# Patient Record
Sex: Female | Born: 1950 | Race: White | Hispanic: No | Marital: Married | Smoking: Former smoker
Health system: Southern US, Community
[De-identification: ages and names within clinical notes are randomized; demographics above are authoritative.]

## PROBLEM LIST (undated history)

## (undated) DIAGNOSIS — E039 Hypothyroidism, unspecified: Secondary | ICD-10-CM

## (undated) DIAGNOSIS — Z87440 Personal history of urinary (tract) infections: Secondary | ICD-10-CM

## (undated) DIAGNOSIS — K219 Gastro-esophageal reflux disease without esophagitis: Secondary | ICD-10-CM

## (undated) DIAGNOSIS — J189 Pneumonia, unspecified organism: Secondary | ICD-10-CM

## (undated) DIAGNOSIS — Z8719 Personal history of other diseases of the digestive system: Secondary | ICD-10-CM

## (undated) DIAGNOSIS — M069 Rheumatoid arthritis, unspecified: Secondary | ICD-10-CM

## (undated) DIAGNOSIS — T8859XA Other complications of anesthesia, initial encounter: Secondary | ICD-10-CM

## (undated) DIAGNOSIS — M1990 Unspecified osteoarthritis, unspecified site: Secondary | ICD-10-CM

## (undated) DIAGNOSIS — I10 Essential (primary) hypertension: Secondary | ICD-10-CM

## (undated) DIAGNOSIS — K5909 Other constipation: Secondary | ICD-10-CM

## (undated) DIAGNOSIS — T4145XA Adverse effect of unspecified anesthetic, initial encounter: Secondary | ICD-10-CM

## (undated) DIAGNOSIS — J449 Chronic obstructive pulmonary disease, unspecified: Secondary | ICD-10-CM

## (undated) DIAGNOSIS — G43909 Migraine, unspecified, not intractable, without status migrainosus: Secondary | ICD-10-CM

## (undated) HISTORY — PX: TUBAL LIGATION: SHX77

## (undated) HISTORY — PX: KNEE ARTHROSCOPY: SHX127

## (undated) HISTORY — PX: DIAGNOSTIC LAPAROSCOPY: SUR761

## (undated) HISTORY — PX: APPENDECTOMY: SHX54

## (undated) HISTORY — PX: JOINT REPLACEMENT: SHX530

---

## 2017-04-17 NOTE — H&P (Signed)
This is a pleasant 66 year-old female who presents to our clinic today as a new patient with right knee pain.  This began a few years ago and has progressively worsened.  All of her pain is on the medial aspect.  She does note increased instability, but no mechanical symptoms.  Pain is worse with walking.  Of note, she does have rheumatoid arthritis which she has had for about 20 years and has tried a gamut of autoimmune medications.  Currently on Methotrexate and Simponi.  As far as the pain in her knee, she has had multiple Cortisone injections, as well as a course of Visco supplementation injections which have failed to relieve her symptoms.  She knows this is coming down to replacement, but her rheumatologist wants to get her inflammatory arthropathy under control before proceeding with joint replacement.    Past medical history: Significant for rheumatoid arthritis, weight loss, loss of appetite, glasses, ringing in her ears and easy bruising.   Allergies: Macrodantin. Current medications: Methotrexate, Prednisone, Levothyroxine, folic acid, Loratadine and Simponi. Social history: Does not smoke or drink.  She is married.  She does not work.   EXAMINATION: Well-developed, well-nourished female in no acute distress.  Alert and oriented x 3.  Height: 5?2.  Weight: 105 pounds.  Blood pressure: 142/75.  Pulse: 80.  Examination of her right knee reveals range of motion 0-115 degrees.  Medial joint line tenderness.  Moderate patellofemoral crepitus.  Ligaments are stable.  Negative log roll.  Negative straight leg raise.  She is neurovascularly intact distally.     X-RAYS: X-rays show bone on bone in all three compartments.    IMPRESSION: Primary localized osteoarthritis, right knee.  PLAN: At this point Leolia has exhausted all conservative treatment options and the only relief that she will get is from a total knee replacement.  We are going to go ahead and fill out paperwork to proceed with right  total knee replacement.  Risks, benefits and possible complications of surgery have been reviewed.  Rehab and recovery time discussed.  All questions answered.  Paperwork complete.  We want to make sure Dr. Dierdre Forth thinks that her inflammatory arthropathy is under control before proceeding with this.

## 2017-04-19 NOTE — Pre-Procedure Instructions (Signed)
Natalie Franklin  04/19/2017      [id number] - [place], [place] - [address] AT [facility] [address] [place] [address] Phone: [phone] Fax: [phone]    Your procedure is scheduled on August 22  Report to [facility] Admitting at [phone] A.M.  Call this number if you have problems the morning of surgery:  [phone]   Remember:  Do not eat food or drink liquids after midnight.  Continue all other medications as directed by your physician except follow these instructions about you medications    Take these medicines the morning of surgery with A SIP OF WATER levothyroxine (SYNTHROID, LEVOTHROID), loratadine (CLARITIN) , LINZESS, predniSONE (DELTASONE)   7 days prior to surgery STOP taking any Aspirin, Aleve, Naproxen, Ibuprofen, Motrin, Advil, Goody's, BC's, all herbal medications, fish oil, and all vitamins    Do not wear jewelry, make-up or nail polish.  Do not wear lotions, powders, or perfumes, or deoderant.  Do not shave 48 hours prior to surgery.  Men may shave face and neck.  Do not bring valuables to the hospital.  [facility] is not responsible for any belongings or valuables.  Contacts, dentures or bridgework may not be worn into surgery.  Leave your suitcase in the car.  After surgery it may be brought to your room.  For patients admitted to the hospital, discharge time will be determined by your treatment team.  Patients discharged the day of surgery will not be allowed to drive home.    Special instructions:   [facility]- Preparing For Surgery  Before surgery, you can play an important role. Because skin is not sterile, your skin needs to be as free of germs as possible. You can reduce the number of germs on your skin by washing with CHG (chlorahexidine gluconate) Soap before surgery.  CHG is an antiseptic cleaner which kills germs and bonds with the skin to continue killing germs even after  washing.  Please do not use if you have an allergy to CHG or antibacterial soaps. If your skin becomes reddened/irritated stop using the CHG.  Do not shave (including legs and underarms) for at least 48 hours prior to first CHG shower. It is OK to shave your face.  Please follow these instructions carefully.   1. Shower the NIGHT BEFORE SURGERY and the MORNING OF SURGERY with CHG.   2. If you chose to wash your hair, wash your hair first as usual with your normal shampoo.  3. After you shampoo, rinse your hair and body thoroughly to remove the shampoo.  4. Use CHG as you would any other liquid soap. You can apply CHG directly to the skin and wash gently with a scrungie or a clean washcloth.   5. Apply the CHG Soap to your body ONLY FROM THE NECK DOWN.  Do not use on open wounds or open sores. Avoid contact with your eyes, ears, mouth and genitals (private parts). Wash genitals (private parts) with your normal soap.  6. Wash thoroughly, paying special attention to the area where your surgery will be performed.  7. Thoroughly rinse your body with warm water from the neck down.  8. DO NOT shower/wash with your normal soap after using and rinsing off the CHG Soap.  9. Pat yourself dry with a CLEAN TOWEL.   10. Wear CLEAN PAJAMAS   11. Place CLEAN SHEETS on your bed the night of your first  shower and DO NOT SLEEP WITH PETS.    Day of Surgery: Do not apply any deodorants/lotions. Please wear clean clothes to the hospital/surgery center.      Please read over the following fact sheets that you were given.

## 2017-04-20 ENCOUNTER — Encounter (HOSPITAL_COMMUNITY)
Admission: RE | Admit: 2017-04-20 | Discharge: 2017-04-20 | Disposition: A | Discharge status: Home / Self Care | Payer: BLUE CROSS/BLUE SHIELD | Source: Ambulatory Visit | Attending: Orthopedic Surgery | Admitting: Orthopedic Surgery

## 2017-04-20 ENCOUNTER — Encounter (HOSPITAL_COMMUNITY): Payer: Self-pay

## 2017-04-20 DIAGNOSIS — I10 Essential (primary) hypertension: Secondary | ICD-10-CM | POA: Insufficient documentation

## 2017-04-20 DIAGNOSIS — Z01812 Encounter for preprocedural laboratory examination: Secondary | ICD-10-CM | POA: Insufficient documentation

## 2017-04-20 HISTORY — DX: Essential (primary) hypertension: I10

## 2017-04-20 HISTORY — DX: Adverse effect of unspecified anesthetic, initial encounter: T41.45XA

## 2017-04-20 HISTORY — DX: Other complications of anesthesia, initial encounter: T88.59XA

## 2017-04-20 HISTORY — DX: Other constipation: K59.09

## 2017-04-20 HISTORY — DX: Gastro-esophageal reflux disease without esophagitis: K21.9

## 2017-04-20 HISTORY — DX: Rheumatoid arthritis, unspecified: M06.9

## 2017-04-20 HISTORY — DX: Personal history of urinary (tract) infections: Z87.440

## 2017-04-20 HISTORY — DX: Hypothyroidism, unspecified: E03.9

## 2017-04-20 HISTORY — DX: Unspecified osteoarthritis, unspecified site: M19.90

## 2017-04-20 HISTORY — DX: Personal history of other diseases of the digestive system: Z87.19

## 2017-04-20 LAB — URINALYSIS, COMPLETE (UACMP) WITH MICROSCOPIC
Bacteria, UA: NONE SEEN
Bilirubin Urine: NEGATIVE
GLUCOSE, UA: NEGATIVE mg/dL
KETONES UR: NEGATIVE mg/dL
Leukocytes, UA: NEGATIVE
NITRITE: NEGATIVE
PH: 5 (ref 5.0–8.0)
Protein, ur: NEGATIVE mg/dL
Specific Gravity, Urine: 1.013 (ref 1.005–1.030)

## 2017-04-20 LAB — BASIC METABOLIC PANEL
ANION GAP: 10 (ref 5–15)
BUN: 7 mg/dL (ref 6–20)
CO2: 22 mmol/L (ref 22–32)
Calcium: 8.9 mg/dL (ref 8.9–10.3)
Chloride: 104 mmol/L (ref 101–111)
Creatinine, Ser: 0.6 mg/dL (ref 0.44–1.00)
GFR calc Af Amer: >60 mL/min (ref >60)
GFR calc non Af Amer: >60 mL/min (ref >60)
GLUCOSE: 105 mg/dL — AB (ref 65–99)
POTASSIUM: 3.5 mmol/L (ref 3.5–5.1)
Sodium: 136 mmol/L (ref 135–145)

## 2017-04-20 LAB — CBC
HEMATOCRIT: 42.2 % (ref 36.0–46.0)
HEMOGLOBIN: 13.9 g/dL (ref 12.0–15.0)
MCH: 29.3 pg (ref 26.0–34.0)
MCHC: 32.9 g/dL (ref 30.0–36.0)
MCV: 88.8 fL (ref 78.0–100.0)
Platelets: 432 10*3/uL — ABNORMAL HIGH (ref 150–400)
RBC: 4.75 MIL/uL (ref 3.87–5.11)
RDW: 14 % (ref 11.5–15.5)
WBC: 17.1 10*3/uL — ABNORMAL HIGH (ref 4.0–10.5)

## 2017-04-20 LAB — SURGICAL PCR SCREEN
MRSA, PCR: NEGATIVE
Staphylococcus aureus: NEGATIVE

## 2017-04-20 LAB — ABO/RH: ABO/RH(D): AB POS

## 2017-04-20 LAB — TYPE AND SCREEN
ABO/RH(D): AB POS
Antibody Screen: NEGATIVE

## 2017-04-20 NOTE — Progress Notes (Signed)
Chart will be given to anesthesia for review due to abnormal lab WBC 17.1k.

## 2017-04-20 NOTE — Progress Notes (Signed)
[facility]  Rheumatologist- Dr. Dierdre Forth  Cardiologist - Denies  Chest x-ray - 06/21/16 (CE)  EKG - 110/14/17 Pending Records from [facility]  Stress Test - Denies  ECHO - Denies  Cardiac Cath - Denies  Sleep Study - Denies CPAP - None  Chart will be given to anesthesia pending record requests.  Pt denies having chest pain, sob, or fever at this time. All instructions explained to the pt, with a verbal understanding of the material. Pt has stopped taking Methotrexate since 04/18/17 (2 weeks prior), as directed by her rheumatologist. Pt agrees to go over the instructions while at home for a better understanding. The opportunity to ask questions was provided.

## 2017-04-21 LAB — URINE CULTURE: CULTURE: NO GROWTH

## 2017-04-23 NOTE — Progress Notes (Addendum)
Anesthesia Chart Review:  Pt is a 66 year old female scheduled for R total knee arthroplasty on 05/02/2017 with Mckinley Jewel, MD  - PCP is Levan Hurst, MD (notes in care everywhere)  - rheumatologist is Alben Deeds, MD  PMH includes:  HTN, RA, hypothyroidism.  Never smoker. BMI 18.5  Medications include: golimumab, levothyroxine, methrotrexate (stopped 04/18/17 for surgery), prednisone.  BP 123/66   Pulse 91   Temp 36.4 C (Oral)   Resp 20   Ht 5\' 2"  (1.575 m)   Wt 101 lb 1.6 oz (45.9 kg)   SpO2 100%   BMI 18.49 kg/m   Preoperative labs reviewed.   - WBC 17.1.  Pt has had intermittently elevated WBC counts in the past (see care everywhere) and saw hematologist , MD 07/17/16 for leukocytosis; Dr. 13/6/17 felt WBC increase was due either to RA process or prednisone use.   - Will recheck CBC DOS.   CXR 06/24/16 (care everywhere): COPD changes noted. Negative acute.   EKG 06/24/16 (care everywhere): NSR.  - Tracing requested  No sx of acute illness documented at PAT.  I notified Sherri in Dr. 06/26/16 office of elevated WBC, prior eval by hematology.   If pt not acutely ill, and no changes in pt's health status, I anticipate pt can proceed with surgery as scheduled.   Greig Right, FNP-BC [facility]/Anesthesiology Phone: [phone] 04/23/2017 12:10 PM

## 2017-04-30 ENCOUNTER — Ambulatory Visit (HOSPITAL_COMMUNITY)
Admission: RE | Admit: 2017-04-30 | Discharge: 2017-04-30 | Disposition: A | Discharge status: Home / Self Care | Payer: BLUE CROSS/BLUE SHIELD | Source: Ambulatory Visit | Attending: Orthopedic Surgery | Admitting: Orthopedic Surgery

## 2017-04-30 ENCOUNTER — Other Ambulatory Visit (HOSPITAL_COMMUNITY): Payer: Self-pay | Admitting: Orthopedic Surgery

## 2017-04-30 DIAGNOSIS — M79605 Pain in left leg: Secondary | ICD-10-CM | POA: Diagnosis present

## 2017-04-30 DIAGNOSIS — X58XXXA Exposure to other specified factors, initial encounter: Secondary | ICD-10-CM | POA: Insufficient documentation

## 2017-04-30 DIAGNOSIS — S8012XA Contusion of left lower leg, initial encounter: Secondary | ICD-10-CM | POA: Insufficient documentation

## 2017-04-30 DIAGNOSIS — M7989 Other specified soft tissue disorders: Secondary | ICD-10-CM | POA: Insufficient documentation

## 2017-04-30 NOTE — Progress Notes (Addendum)
VASCULAR LAB PRELIMINARY  PRELIMINARY  PRELIMINARY  PRELIMINARY  Left lower extremity venous duplex completed.    Preliminary report:  There is no DVT or SVT noted in the left lower extremity.  Area of mixed echoes noted in the proximal to mid calf, suggestive of muscle tear.   Called results to Andrena Mews, [facility], RVT 04/30/2017, 3:57 PM

## 2017-05-01 MED ORDER — CEFAZOLIN SODIUM-DEXTROSE 2-4 GM/100ML-% IV SOLN
2.0000 g | INTRAVENOUS | Status: AC
  Administered 2017-05-02: 2 g via INTRAVENOUS
  Filled 2017-05-01: qty 100

## 2017-05-01 MED ORDER — TRANEXAMIC ACID 1000 MG/10ML IV SOLN
1000.0000 mg | INTRAVENOUS | Status: AC
  Administered 2017-05-02: 1000 mg via INTRAVENOUS
  Filled 2017-05-01: qty 10

## 2017-05-01 NOTE — Anesthesia Preprocedure Evaluation (Addendum)
Anesthesia Evaluation  Patient identified by MRN, date of birth, ID band Patient awake    Reviewed: Allergy & Precautions, NPO status , Patient's Chart, lab work & pertinent test results  Airway Mallampati: II  TM Distance: >3 FB Neck ROM: Full    Dental no notable dental hx. (+) Edentulous Upper, Edentulous Lower, Upper Dentures, Lower Dentures   Pulmonary neg pulmonary ROS,    Pulmonary exam normal breath sounds clear to auscultation       Cardiovascular hypertension, negative cardio ROS Normal cardiovascular exam Rhythm:Regular Rate:Normal     Neuro/Psych  Headaches, negative neurological ROS  negative psych ROS   GI/Hepatic negative GI ROS, Neg liver ROS, hiatal hernia, GERD  ,  Endo/Other  negative endocrine ROSHypothyroidism   Renal/GU negative Renal ROS  negative genitourinary   Musculoskeletal negative musculoskeletal ROS (+) Arthritis , Osteoarthritis and Rheumatoid disorders,    Abdominal   Peds negative pediatric ROS (+)  Hematology negative hematology ROS (+)   Anesthesia Other Findings   Reproductive/Obstetrics negative OB ROS                            Anesthesia Physical Anesthesia Plan  ASA: II  Anesthesia Plan: Spinal   Post-op Pain Management:  Regional for Post-op pain   Induction:   PONV Risk Score and Plan: 2 and Ondansetron, Dexamethasone, Treatment may vary due to age or medical condition and Midazolam  Airway Management Planned: Nasal Cannula, Natural Airway and Mask  Additional Equipment:   Intra-op Plan:   Post-operative Plan:   Informed Consent:   Plan Discussed with:   Anesthesia Plan Comments: (  )        Anesthesia Quick Evaluation

## 2017-05-02 ENCOUNTER — Inpatient Hospital Stay (HOSPITAL_COMMUNITY)
Admission: RE | Admit: 2017-05-02 | Discharge: 2017-05-04 | DRG: 470 | Disposition: A | Discharge status: Home Health | Payer: BLUE CROSS/BLUE SHIELD | Source: Ambulatory Visit | Attending: Orthopedic Surgery | Admitting: Orthopedic Surgery

## 2017-05-02 ENCOUNTER — Inpatient Hospital Stay (HOSPITAL_COMMUNITY): Payer: BLUE CROSS/BLUE SHIELD | Admitting: Emergency Medicine

## 2017-05-02 ENCOUNTER — Inpatient Hospital Stay (HOSPITAL_COMMUNITY): Payer: BLUE CROSS/BLUE SHIELD

## 2017-05-02 ENCOUNTER — Encounter (HOSPITAL_COMMUNITY)
Admission: RE | Disposition: A | Discharge status: Home Health | Payer: Self-pay | Source: Ambulatory Visit | Attending: Orthopedic Surgery

## 2017-05-02 ENCOUNTER — Encounter (HOSPITAL_COMMUNITY): Payer: Self-pay | Admitting: Certified Registered Nurse Anesthetist

## 2017-05-02 DIAGNOSIS — K219 Gastro-esophageal reflux disease without esophagitis: Secondary | ICD-10-CM | POA: Diagnosis present

## 2017-05-02 DIAGNOSIS — M1711 Unilateral primary osteoarthritis, right knee: Principal | ICD-10-CM

## 2017-05-02 DIAGNOSIS — Z79899 Other long term (current) drug therapy: Secondary | ICD-10-CM

## 2017-05-02 DIAGNOSIS — Z87440 Personal history of urinary (tract) infections: Secondary | ICD-10-CM

## 2017-05-02 DIAGNOSIS — M659 Synovitis and tenosynovitis, unspecified: Secondary | ICD-10-CM | POA: Diagnosis present

## 2017-05-02 DIAGNOSIS — M069 Rheumatoid arthritis, unspecified: Secondary | ICD-10-CM | POA: Diagnosis present

## 2017-05-02 DIAGNOSIS — I10 Essential (primary) hypertension: Secondary | ICD-10-CM | POA: Diagnosis present

## 2017-05-02 DIAGNOSIS — Z972 Presence of dental prosthetic device (complete) (partial): Secondary | ICD-10-CM | POA: Diagnosis not present

## 2017-05-02 DIAGNOSIS — D62 Acute posthemorrhagic anemia: Secondary | ICD-10-CM | POA: Diagnosis not present

## 2017-05-02 DIAGNOSIS — E039 Hypothyroidism, unspecified: Secondary | ICD-10-CM | POA: Diagnosis present

## 2017-05-02 DIAGNOSIS — M85861 Other specified disorders of bone density and structure, right lower leg: Secondary | ICD-10-CM | POA: Diagnosis present

## 2017-05-02 DIAGNOSIS — K5909 Other constipation: Secondary | ICD-10-CM | POA: Diagnosis present

## 2017-05-02 DIAGNOSIS — M25561 Pain in right knee: Secondary | ICD-10-CM | POA: Diagnosis present

## 2017-05-02 DIAGNOSIS — Z7952 Long term (current) use of systemic steroids: Secondary | ICD-10-CM | POA: Diagnosis not present

## 2017-05-02 DIAGNOSIS — Z96659 Presence of unspecified artificial knee joint: Secondary | ICD-10-CM

## 2017-05-02 HISTORY — DX: Pneumonia, unspecified organism: J18.9

## 2017-05-02 HISTORY — DX: Migraine, unspecified, not intractable, without status migrainosus: G43.909

## 2017-05-02 HISTORY — PX: TOTAL KNEE ARTHROPLASTY: SHX125

## 2017-05-02 LAB — CBC
HCT: 38.7 % (ref 36.0–46.0)
Hemoglobin: 12.6 g/dL (ref 12.0–15.0)
MCH: 28.7 pg (ref 26.0–34.0)
MCHC: 32.6 g/dL (ref 30.0–36.0)
MCV: 88.2 fL (ref 78.0–100.0)
PLATELETS: 425 10*3/uL — AB (ref 150–400)
RBC: 4.39 MIL/uL (ref 3.87–5.11)
RDW: 14 % (ref 11.5–15.5)
WBC: 14.6 10*3/uL — ABNORMAL HIGH (ref 4.0–10.5)

## 2017-05-02 SURGERY — ARTHROPLASTY, KNEE, TOTAL
Anesthesia: Spinal | Laterality: Right

## 2017-05-02 MED ORDER — MEPERIDINE HCL 25 MG/ML IJ SOLN: 6.2500 mg | INTRAMUSCULAR | Status: DC | PRN

## 2017-05-02 MED ORDER — METOCLOPRAMIDE HCL 5 MG PO TABS
5.0000 mg | ORAL_TABLET | Freq: Three times a day (TID) | ORAL | Status: DC | PRN
Start: 2017-05-02 — End: 2017-05-04

## 2017-05-02 MED ORDER — ONDANSETRON HCL 4 MG/2ML IJ SOLN: 4.0000 mg | Freq: Once | INTRAMUSCULAR | Status: DC | PRN

## 2017-05-02 MED ORDER — CHLORHEXIDINE GLUCONATE 4 % EX LIQD: 60.0000 mL | Freq: Once | CUTANEOUS | Status: DC

## 2017-05-02 MED ORDER — BUPIVACAINE HCL (PF) 0.5 % IJ SOLN
INTRAMUSCULAR | Status: AC
  Filled 2017-05-02: qty 30

## 2017-05-02 MED ORDER — ALUM & MAG HYDROXIDE-SIMETH 200-200-20 MG/5ML PO SUSP: 30.0000 mL | ORAL | Status: DC | PRN

## 2017-05-02 MED ORDER — ZOLPIDEM TARTRATE 5 MG PO TABS: 5.0000 mg | ORAL_TABLET | Freq: Every evening | ORAL | Status: DC | PRN

## 2017-05-02 MED ORDER — GLYCOPYRROLATE 0.2 MG/ML IJ SOLN
INTRAMUSCULAR | Status: DC | PRN
  Administered 2017-05-02 (×2): 0.1 mg via INTRAVENOUS

## 2017-05-02 MED ORDER — MENTHOL 3 MG MT LOZG: 1.0000 | LOZENGE | OROMUCOSAL | Status: DC | PRN

## 2017-05-02 MED ORDER — BUPIVACAINE HCL (PF) 0.75 % IJ SOLN
INTRAMUSCULAR | Status: DC | PRN
  Administered 2017-05-02: 20 mL via PERINEURAL

## 2017-05-02 MED ORDER — METOCLOPRAMIDE HCL 5 MG/ML IJ SOLN
INTRAMUSCULAR | Status: DC | PRN
  Administered 2017-05-02: 10 mg via INTRAVENOUS

## 2017-05-02 MED ORDER — HYDROCODONE-ACETAMINOPHEN 7.5-325 MG PO TABS: ORAL_TABLET | ORAL | 0 refills | Status: DC

## 2017-05-02 MED ORDER — LEVOTHYROXINE SODIUM 88 MCG PO TABS
88.0000 ug | ORAL_TABLET | Freq: Every day | ORAL | Status: DC
  Administered 2017-05-03 – 2017-05-04 (×2): 88 ug via ORAL
  Filled 2017-05-02 (×2): qty 1

## 2017-05-02 MED ORDER — ONDANSETRON HCL 4 MG/2ML IJ SOLN
INTRAMUSCULAR | Status: DC | PRN
  Administered 2017-05-02: 4 mg via INTRAVENOUS

## 2017-05-02 MED ORDER — BISACODYL 5 MG PO TBEC: 5.0000 mg | DELAYED_RELEASE_TABLET | Freq: Every day | ORAL | Status: DC | PRN

## 2017-05-02 MED ORDER — BUPIVACAINE IN DEXTROSE 0.75-8.25 % IT SOLN
INTRATHECAL | Status: DC | PRN
  Administered 2017-05-02: 11 mg via INTRATHECAL

## 2017-05-02 MED ORDER — FENTANYL CITRATE (PF) 250 MCG/5ML IJ SOLN
INTRAMUSCULAR | Status: AC
Start: 2017-05-02 — End: ?
  Filled 2017-05-02: qty 5

## 2017-05-02 MED ORDER — METHOCARBAMOL 1000 MG/10ML IJ SOLN: 500.0000 mg | Freq: Four times a day (QID) | INTRAVENOUS | Status: DC | PRN

## 2017-05-02 MED ORDER — POTASSIUM CHLORIDE IN NACL 20-0.9 MEQ/L-% IV SOLN
INTRAVENOUS | Status: DC
  Administered 2017-05-02 – 2017-05-03 (×2): via INTRAVENOUS
  Filled 2017-05-02 (×2): qty 1000

## 2017-05-02 MED ORDER — ASPIRIN EC 325 MG PO TBEC: 325.0000 mg | DELAYED_RELEASE_TABLET | Freq: Every day | ORAL | 0 refills | Status: DC

## 2017-05-02 MED ORDER — MAGNESIUM CITRATE PO SOLN: 1.0000 | Freq: Once | ORAL | Status: DC | PRN

## 2017-05-02 MED ORDER — METOCLOPRAMIDE HCL 5 MG/ML IJ SOLN
INTRAMUSCULAR | Status: AC
  Filled 2017-05-02: qty 2

## 2017-05-02 MED ORDER — PHENOL 1.4 % MT LIQD: 1.0000 | OROMUCOSAL | Status: DC | PRN

## 2017-05-02 MED ORDER — SENNOSIDES-DOCUSATE SODIUM 8.6-50 MG PO TABS: 1.0000 | ORAL_TABLET | Freq: Every evening | ORAL | Status: DC | PRN

## 2017-05-02 MED ORDER — PROPOFOL 500 MG/50ML IV EMUL
INTRAVENOUS | Status: DC | PRN
  Administered 2017-05-02: 20 ug/kg/min via INTRAVENOUS

## 2017-05-02 MED ORDER — ACETAMINOPHEN 325 MG PO TABS: 650.0000 mg | ORAL_TABLET | Freq: Four times a day (QID) | ORAL | Status: DC | PRN

## 2017-05-02 MED ORDER — ENSURE ENLIVE PO LIQD
237.0000 mL | Freq: Two times a day (BID) | ORAL | Status: DC
  Administered 2017-05-03 – 2017-05-04 (×2): 237 mL via ORAL

## 2017-05-02 MED ORDER — METHOCARBAMOL 500 MG PO TABS
500.0000 mg | ORAL_TABLET | Freq: Four times a day (QID) | ORAL | Status: DC | PRN
  Administered 2017-05-02 – 2017-05-03 (×2): 500 mg via ORAL
  Filled 2017-05-02 (×2): qty 1

## 2017-05-02 MED ORDER — MIDAZOLAM HCL 2 MG/2ML IJ SOLN
INTRAMUSCULAR | Status: AC
  Administered 2017-05-02: 2 mg
  Filled 2017-05-02: qty 2

## 2017-05-02 MED ORDER — ASPIRIN EC 325 MG PO TBEC
325.0000 mg | DELAYED_RELEASE_TABLET | Freq: Every day | ORAL | Status: DC
  Administered 2017-05-03 – 2017-05-04 (×2): 325 mg via ORAL
  Filled 2017-05-02 (×2): qty 1

## 2017-05-02 MED ORDER — PREDNISONE 5 MG PO TABS
5.0000 mg | ORAL_TABLET | Freq: Two times a day (BID) | ORAL | Status: DC
  Administered 2017-05-02 – 2017-05-04 (×5): 5 mg via ORAL
  Filled 2017-05-02 (×5): qty 1

## 2017-05-02 MED ORDER — FENTANYL CITRATE (PF) 250 MCG/5ML IJ SOLN
INTRAMUSCULAR | Status: DC | PRN
  Administered 2017-05-02 (×2): 25 ug via INTRAVENOUS

## 2017-05-02 MED ORDER — KETAMINE HCL 10 MG/ML IJ SOLN
INTRAMUSCULAR | Status: DC | PRN
  Administered 2017-05-02 (×2): 10 mg via INTRAVENOUS

## 2017-05-02 MED ORDER — SODIUM CHLORIDE 0.9 % IJ SOLN
INTRAMUSCULAR | Status: DC | PRN
  Administered 2017-05-02: 40 mL via INTRAVENOUS

## 2017-05-02 MED ORDER — PROPOFOL 10 MG/ML IV BOLUS
INTRAVENOUS | Status: DC | PRN
  Administered 2017-05-02 (×4): 20 mg via INTRAVENOUS

## 2017-05-02 MED ORDER — ONDANSETRON HCL 4 MG/2ML IJ SOLN
INTRAMUSCULAR | Status: AC
  Filled 2017-05-02: qty 2

## 2017-05-02 MED ORDER — DEXAMETHASONE SODIUM PHOSPHATE 10 MG/ML IJ SOLN
INTRAMUSCULAR | Status: AC
  Filled 2017-05-02: qty 1

## 2017-05-02 MED ORDER — METOPROLOL TARTRATE 5 MG/5ML IV SOLN
INTRAVENOUS | Status: AC
  Filled 2017-05-02: qty 5

## 2017-05-02 MED ORDER — DEXAMETHASONE SODIUM PHOSPHATE 10 MG/ML IJ SOLN
INTRAMUSCULAR | Status: DC | PRN
  Administered 2017-05-02: 10 mg via INTRAVENOUS

## 2017-05-02 MED ORDER — METHOTREXATE 2.5 MG PO TABS: 12.5000 mg | ORAL_TABLET | ORAL | Status: DC

## 2017-05-02 MED ORDER — FENTANYL CITRATE (PF) 100 MCG/2ML IJ SOLN: 25.0000 ug | INTRAMUSCULAR | Status: DC | PRN

## 2017-05-02 MED ORDER — HYDROCODONE-ACETAMINOPHEN 7.5-325 MG PO TABS
1.0000 | ORAL_TABLET | ORAL | Status: DC | PRN
  Administered 2017-05-02 (×2): 1 via ORAL
  Administered 2017-05-03: 2 via ORAL
  Administered 2017-05-03 (×2): 1 via ORAL
  Administered 2017-05-03 – 2017-05-04 (×3): 2 via ORAL
  Filled 2017-05-02 (×2): qty 2
  Filled 2017-05-02: qty 1
  Filled 2017-05-02: qty 2
  Filled 2017-05-02 (×3): qty 1
  Filled 2017-05-02: qty 2
  Filled 2017-05-02: qty 1

## 2017-05-02 MED ORDER — FENTANYL CITRATE (PF) 100 MCG/2ML IJ SOLN
INTRAMUSCULAR | Status: AC
  Administered 2017-05-02: 50 ug
  Filled 2017-05-02: qty 2

## 2017-05-02 MED ORDER — LIDOCAINE 2% (20 MG/ML) 5 ML SYRINGE
INTRAMUSCULAR | Status: AC
  Filled 2017-05-02: qty 5

## 2017-05-02 MED ORDER — ONDANSETRON HCL 4 MG PO TABS: 4.0000 mg | ORAL_TABLET | Freq: Four times a day (QID) | ORAL | Status: DC | PRN

## 2017-05-02 MED ORDER — PHENYLEPHRINE HCL 10 MG/ML IJ SOLN
INTRAVENOUS | Status: DC | PRN
  Administered 2017-05-02: 25 ug/min via INTRAVENOUS

## 2017-05-02 MED ORDER — HYDROMORPHONE HCL 1 MG/ML IJ SOLN
0.5000 mg | INTRAMUSCULAR | Status: DC | PRN
  Administered 2017-05-03: 1 mg via INTRAVENOUS
  Filled 2017-05-02: qty 1

## 2017-05-02 MED ORDER — PROPOFOL 1000 MG/100ML IV EMUL
INTRAVENOUS | Status: AC
  Filled 2017-05-02: qty 100

## 2017-05-02 MED ORDER — ONDANSETRON HCL 4 MG PO TABS: 4.0000 mg | ORAL_TABLET | Freq: Three times a day (TID) | ORAL | 0 refills | Status: DC | PRN

## 2017-05-02 MED ORDER — DIPHENHYDRAMINE HCL 12.5 MG/5ML PO ELIX: 12.5000 mg | ORAL_SOLUTION | ORAL | Status: DC | PRN

## 2017-05-02 MED ORDER — METOPROLOL TARTARATE 1 MG/ML SYRINGE (5ML)
Status: DC | PRN
  Administered 2017-05-02: 2 mg via INTRAVENOUS
  Administered 2017-05-02 (×3): 1 mg via INTRAVENOUS

## 2017-05-02 MED ORDER — MIDAZOLAM HCL 2 MG/2ML IJ SOLN
INTRAMUSCULAR | Status: AC
  Filled 2017-05-02: qty 2

## 2017-05-02 MED ORDER — BUPIVACAINE LIPOSOME 1.3 % IJ SUSP
20.0000 mL | Freq: Once | INTRAMUSCULAR | Status: AC
  Administered 2017-05-02: 20 mL
  Filled 2017-05-02: qty 20

## 2017-05-02 MED ORDER — LINACLOTIDE 145 MCG PO CAPS
290.0000 ug | ORAL_CAPSULE | Freq: Every day | ORAL | Status: DC
  Administered 2017-05-03: 290 ug via ORAL
  Filled 2017-05-02 (×2): qty 2

## 2017-05-02 MED ORDER — LACTATED RINGERS IV SOLN
INTRAVENOUS | Status: DC
  Administered 2017-05-02 (×3): via INTRAVENOUS

## 2017-05-02 MED ORDER — ACETAMINOPHEN 650 MG RE SUPP: 650.0000 mg | Freq: Four times a day (QID) | RECTAL | Status: DC | PRN

## 2017-05-02 MED ORDER — SODIUM CHLORIDE 0.9 % IR SOLN
Status: DC | PRN
  Administered 2017-05-02: 3000 mL

## 2017-05-02 MED ORDER — CEFAZOLIN SODIUM-DEXTROSE 1-4 GM/50ML-% IV SOLN
1.0000 g | Freq: Four times a day (QID) | INTRAVENOUS | Status: AC
  Administered 2017-05-02 (×2): 1 g via INTRAVENOUS
  Filled 2017-05-02 (×2): qty 50

## 2017-05-02 MED ORDER — DOCUSATE SODIUM 100 MG PO CAPS
100.0000 mg | ORAL_CAPSULE | Freq: Two times a day (BID) | ORAL | Status: DC
  Administered 2017-05-02 – 2017-05-04 (×5): 100 mg via ORAL
  Filled 2017-05-02 (×5): qty 1

## 2017-05-02 MED ORDER — METOCLOPRAMIDE HCL 5 MG/ML IJ SOLN
5.0000 mg | Freq: Three times a day (TID) | INTRAMUSCULAR | Status: DC | PRN
  Administered 2017-05-03: 10 mg via INTRAVENOUS
  Filled 2017-05-02: qty 2

## 2017-05-02 MED ORDER — MIDAZOLAM HCL 2 MG/2ML IJ SOLN
INTRAMUSCULAR | Status: DC | PRN
  Administered 2017-05-02 (×2): 1 mg via INTRAVENOUS

## 2017-05-02 MED ORDER — LIDOCAINE HCL (CARDIAC) 20 MG/ML IV SOLN
INTRAVENOUS | Status: DC | PRN
  Administered 2017-05-02: 60 mg via INTRATRACHEAL

## 2017-05-02 MED ORDER — KETAMINE HCL-SODIUM CHLORIDE 100-0.9 MG/10ML-% IV SOSY
PREFILLED_SYRINGE | INTRAVENOUS | Status: AC
  Filled 2017-05-02: qty 10

## 2017-05-02 MED ORDER — ONDANSETRON HCL 4 MG/2ML IJ SOLN
4.0000 mg | Freq: Four times a day (QID) | INTRAMUSCULAR | Status: DC | PRN
  Administered 2017-05-03 (×2): 4 mg via INTRAVENOUS
  Filled 2017-05-02 (×2): qty 2

## 2017-05-02 SURGICAL SUPPLY — 58 items
BANDAGE ACE 4X5 VEL STRL LF (GAUZE/BANDAGES/DRESSINGS) ×2 IMPLANT
BANDAGE ACE 6X5 VEL STRL LF (GAUZE/BANDAGES/DRESSINGS) ×2 IMPLANT
BANDAGE ESMARK 6X9 LF (GAUZE/BANDAGES/DRESSINGS) ×1 IMPLANT
BLADE SAG 18X100X1.27 (BLADE) ×4 IMPLANT
BNDG ESMARK 6X9 LF (GAUZE/BANDAGES/DRESSINGS) ×2
BOWL SMART MIX CTS (DISPOSABLE) IMPLANT
CAPT KNEE TOTAL 3 ×2 IMPLANT
CEMENT BONE SIMPLEX SPEEDSET (Cement) ×4 IMPLANT
COVER SURGICAL LIGHT HANDLE (MISCELLANEOUS) ×2 IMPLANT
CUFF TOURNIQUET SINGLE 34IN LL (TOURNIQUET CUFF) ×2 IMPLANT
DRAPE EXTREMITY T 121X128X90 (DRAPE) ×2 IMPLANT
DRAPE U-SHAPE 47X51 STRL (DRAPES) ×2 IMPLANT
DRESSING AQUACEL AG SP 3.5X10 (GAUZE/BANDAGES/DRESSINGS) ×1 IMPLANT
DRSG AQUACEL AG ADV 3.5X10 (GAUZE/BANDAGES/DRESSINGS) ×2 IMPLANT
DRSG AQUACEL AG SP 3.5X10 (GAUZE/BANDAGES/DRESSINGS) ×2
DURAPREP 26ML APPLICATOR (WOUND CARE) ×4 IMPLANT
ELECT CAUTERY BLADE 6.4 (BLADE) ×2 IMPLANT
ELECT REM PT RETURN 9FT ADLT (ELECTROSURGICAL) ×2
ELECTRODE REM PT RTRN 9FT ADLT (ELECTROSURGICAL) ×1 IMPLANT
FACESHIELD WRAPAROUND (MASK) ×4 IMPLANT
GLOVE BIOGEL PI IND STRL 7.0 (GLOVE) ×1 IMPLANT
GLOVE BIOGEL PI IND STRL 8 (GLOVE) ×1 IMPLANT
GLOVE BIOGEL PI INDICATOR 7.0 (GLOVE) ×1
GLOVE BIOGEL PI INDICATOR 8 (GLOVE) ×1
GLOVE ECLIPSE 7.0 STRL STRAW (GLOVE) ×2 IMPLANT
GLOVE ORTHO TXT STRL SZ7.5 (GLOVE) ×2 IMPLANT
GLOVE SURG SS PI 7.5 STRL IVOR (GLOVE) ×2 IMPLANT
GLOVE SURG SS PI 8.0 STRL IVOR (GLOVE) ×4 IMPLANT
GOWN STRL REUS W/ TWL LRG LVL3 (GOWN DISPOSABLE) ×2 IMPLANT
GOWN STRL REUS W/ TWL XL LVL3 (GOWN DISPOSABLE) ×1 IMPLANT
GOWN STRL REUS W/TWL LRG LVL3 (GOWN DISPOSABLE) ×2
GOWN STRL REUS W/TWL XL LVL3 (GOWN DISPOSABLE) ×1
HANDPIECE INTERPULSE COAX TIP (DISPOSABLE) ×1
IMMOBILIZER KNEE 22 UNIV (SOFTGOODS) IMPLANT
KIT BASIN OR (CUSTOM PROCEDURE TRAY) ×2 IMPLANT
KIT ROOM TURNOVER OR (KITS) ×2 IMPLANT
MANIFOLD NEPTUNE II (INSTRUMENTS) ×2 IMPLANT
NEEDLE 18GX1X1/2 (RX/OR ONLY) (NEEDLE) ×2 IMPLANT
NEEDLE HYPO 25GX1X1/2 BEV (NEEDLE) IMPLANT
NS IRRIG 1000ML POUR BTL (IV SOLUTION) ×2 IMPLANT
PACK TOTAL JOINT (CUSTOM PROCEDURE TRAY) ×2 IMPLANT
PAD ARMBOARD 7.5X6 YLW CONV (MISCELLANEOUS) ×4 IMPLANT
SET HNDPC FAN SPRY TIP SCT (DISPOSABLE) ×1 IMPLANT
STRIP CLOSURE SKIN 1/2X4 (GAUZE/BANDAGES/DRESSINGS) ×2 IMPLANT
SUCTION FRAZIER HANDLE 10FR (MISCELLANEOUS)
SUCTION TUBE FRAZIER 10FR DISP (MISCELLANEOUS) IMPLANT
SUT MNCRL AB 4-0 PS2 18 (SUTURE) ×2 IMPLANT
SUT VIC AB 0 CT1 27 (SUTURE)
SUT VIC AB 0 CT1 27XBRD ANBCTR (SUTURE) IMPLANT
SUT VIC AB 1 CTX 36 (SUTURE) ×1
SUT VIC AB 1 CTX36XBRD ANBCTR (SUTURE) ×1 IMPLANT
SUT VIC AB 2-0 CT1 27 (SUTURE) ×2
SUT VIC AB 2-0 CT1 TAPERPNT 27 (SUTURE) ×2 IMPLANT
SYR 50ML LL SCALE MARK (SYRINGE) ×2 IMPLANT
SYR CONTROL 10ML LL (SYRINGE) IMPLANT
TOWEL GREEN STERILE (TOWEL DISPOSABLE) ×2 IMPLANT
TRAY CATH 16FR W/PLASTIC CATH (SET/KITS/TRAYS/PACK) IMPLANT
WATER STERILE IRR 1000ML POUR (IV SOLUTION) ×2 IMPLANT

## 2017-05-02 NOTE — Interval H&P Note (Signed)
History and Physical Interval Note:  05/02/2017 7:38 AM  Natalie Franklin  has presented today for surgery, with the diagnosis of djd right knee  The various methods of treatment have been discussed with the patient and family. After consideration of risks, benefits and other options for treatment, the patient has consented to  Procedure(s): TOTAL KNEE ARTHROPLASTY (Right) as a surgical intervention .  The patient's history has been reviewed, patient examined, no change in status, stable for surgery.  I have reviewed the patient's chart and labs.  Questions were answered to the patient's satisfaction.     Loreta Ave

## 2017-05-02 NOTE — Transfer of Care (Signed)
Immediate Anesthesia Transfer of Care Note  Patient: MEEYA GOLDIN  Procedure(s) Performed: Procedure(s): TOTAL KNEE ARTHROPLASTY (Right)  Patient Location: PACU  Anesthesia Type:MAC and Spinal  Level of Consciousness: awake, alert  and drowsy  Airway & Oxygen Therapy: Patient Spontanous Breathing and Patient connected to nasal cannula oxygen  Post-op Assessment: Report given to RN, Post -op Vital signs reviewed and stable, Patient moving all extremities X 4 and Patient able to stick tongue midline  Post vital signs: Reviewed and stable  Last Vitals:  Vitals:   05/02/17 0729  BP: 127/75  Pulse: 78  Resp: 20  Temp: 36.9 C  SpO2: 98%    Last Pain:  Vitals:   05/02/17 0737  TempSrc:   PainSc: 2       Patients Stated Pain Goal: 2 (86/76/19 5093)  Complications: No apparent anesthesia complications

## 2017-05-02 NOTE — Anesthesia Procedure Notes (Signed)
Spinal  Patient location during procedure: OR Start time: 05/02/2017 8:39 AM End time: 05/02/2017 8:50 AM Staffing Anesthesiologist: Sid Greener Preanesthetic Checklist Completed: patient identified, site marked, surgical consent, pre-op evaluation, timeout performed, IV checked, risks and benefits discussed and monitors and equipment checked Spinal Block Patient position: sitting Prep: DuraPrep Patient monitoring: heart rate, cardiac monitor, continuous pulse ox and blood pressure Approach: midline Location: L3-4 Injection technique: single-shot Needle Needle type: Sprotte  Needle gauge: 24 G Needle length: 9 cm Assessment Sensory level: T4

## 2017-05-02 NOTE — Progress Notes (Signed)
Orthopedic Tech Progress Note Patient Details:  Natalie Franklin 12/07/1950 [id number]  CPM Right Knee CPM Right Knee: On Right Knee Flexion (Degrees): 90 Right Knee Extension (Degrees): 0 Additional Comments: trapeze bar patient helper   [name] 05/02/2017, 11:32 AM Viewed order from doctor's order list

## 2017-05-02 NOTE — Discharge Instructions (Signed)

## 2017-05-02 NOTE — Discharge Summary (Addendum)
Patient ID: Natalie Franklin MRN: [id number] DOB/AGE: 01-28-1951 66 y.o.  Admit date: 05/02/2017 Discharge date: 05/04/2017  Admission Diagnoses:  Principal Problem:   Primary localized osteoarthritis of right knee   Discharge Diagnoses:  Same  Past Medical History:  Diagnosis Date  . Chronic constipation   . Complication of anesthesia    Hard to Awaken post anesthesia  . GERD (gastroesophageal reflux disease)   . History of frequent urinary tract infections   . History of hiatal hernia   . Hypertension   . Hypothyroidism   . Migraine    "none in a couple years" (05/02/2017)  . Osteoarthritis   . Pneumonia    "had it in my younger years; haven't had it in a long time" (05/02/2017)  . Rheumatoid arthritis (HCC)    "qwhere" (05/02/2017)    Surgeries: Procedure(s): TOTAL KNEE ARTHROPLASTY on 05/02/2017   Consultants:   Discharged Condition: Improved  Hospital Course: Natalie Franklin is an 66 y.o. female who was admitted 05/02/2017 for operative treatment ofPrimary localized osteoarthritis of right knee. Patient has severe unremitting pain that affects sleep, daily activities, and work/hobbies. After pre-op clearance the patient was taken to the operating room on 05/02/2017 and underwent  Procedure(s): TOTAL KNEE ARTHROPLASTY.    Patient was given perioperative antibiotics:  Anti-infectives    Start     Dose/Rate Route Frequency Ordered Stop   05/02/17 1430  ceFAZolin (ANCEF) IVPB 1 g/50 mL premix     1 g 100 mL/hr over 30 Minutes Intravenous Every 6 hours 05/02/17 1325 05/02/17 2134   05/02/17 0845  ceFAZolin (ANCEF) IVPB 2g/100 mL premix     2 g 200 mL/hr over 30 Minutes Intravenous On call to O.R. 05/01/17 1224 05/02/17 0908       Patient was given sequential compression devices, early ambulation, and chemoprophylaxis to prevent DVT.  Patient benefited maximally from hospital stay and there were no complications.    Recent vital signs:  Patient Vitals for the  past 24 hrs:  BP Temp Temp src Pulse Resp SpO2  05/04/17 0537 (!) 124/58 98.1 F (36.7 C) Oral 72 18 97 %  05/03/17 2040 119/61 98.6 F (37 C) Oral 67 20 97 %  05/03/17 1300 126/65 98.7 F (37.1 C) Oral 61 18 100 %     Recent laboratory studies:   Recent Labs  05/02/17 0727 05/03/17 0416  WBC 14.6* 16.2*  HGB 12.6 9.7*  HCT 38.7 30.1*  PLT 425* 339  NA  --  135  K  --  4.1  CL  --  107  CO2  --  23  BUN  --  5*  CREATININE  --  0.47  GLUCOSE  --  124*  CALCIUM  --  8.4*     Discharge Medications:   Allergies as of 05/04/2017      Reactions   Macrodantin [nitrofurantoin Macrocrystal] Other (See Comments)   Hospitalized for possible interstitial pulmonary disease CAUSED PT TO GET CHEMICAL PNEUMONIA   Leflunomide Nausea Only   Septra [sulfamethoxazole-trimethoprim] Other (See Comments)   PT HAD BUSTED TENDONS AND TOLD NOT TO TAKE    Albuterol Other (See Comments)   headache   Celecoxib Nausea Only      Medication List    TAKE these medications   aspirin EC 325 MG tablet Take 1 tablet (325 mg total) by mouth daily.   B COMPLEX-VITAMIN B12 PO Take 1 tablet by mouth daily.   Biotin 5000 MCG Subl Place 10,000  mcg under the tongue daily.   folic acid 1 MG tablet Commonly known as:  FOLVITE Take 1 mg by mouth daily before breakfast.   HYDROcodone-acetaminophen 7.5-325 MG tablet Commonly known as:  NORCO Take 1-2 tabs po q4-6 hours prn pain   levothyroxine 88 MCG tablet Commonly known as:  SYNTHROID, LEVOTHROID Take 88 mcg by mouth daily before breakfast.   LINZESS 290 MCG Caps capsule Generic drug:  linaclotide Take 290 mcg by mouth daily.   loratadine 10 MG tablet Commonly known as:  CLARITIN Take 10 mg by mouth daily as needed for allergies.   methotrexate 2.5 MG tablet Commonly known as:  RHEUMATREX Take 12.5 mg by mouth every Thursday. Caution:Chemotherapy. Protect from light.   ondansetron 4 MG tablet Commonly known as:  ZOFRAN Take 1  tablet (4 mg total) by mouth every 8 (eight) hours as needed for nausea or vomiting.   predniSONE 5 MG tablet Commonly known as:  DELTASONE Take 5 mg by mouth 2 (two) times daily. FOR 30 DAYS   SIMPONI ARIA IV Inject into the vein every 28 (twenty-eight) days.            Discharge Care Instructions        Start     Ordered   05/02/17 0000  ondansetron (ZOFRAN) 4 MG tablet  Every 8 hours PRN     05/02/17 0955   05/02/17 0000  aspirin EC 325 MG tablet  Daily    Comments:  1 tab a day for the next 30 days to prevent blood clots   05/02/17 0955   05/02/17 0000  HYDROcodone-acetaminophen (NORCO) 7.5-325 MG tablet     05/02/17 6222      Diagnostic Studies: Dg Knee Right Port  Result Date: 05/02/2017 CLINICAL DATA:  Postop for right knee arthroplasty. EXAM: PORTABLE RIGHT KNEE - 1-2 VIEW COMPARISON:  None. FINDINGS: Total knee arthroplasty. No periprosthetic fracture or acute hardware complication. Expected soft tissue swelling and gas about the knee and within the suprapatellar bursa. IMPRESSION: Expected appearance after right knee arthroplasty. Electronically Signed   By: Jeronimo Greaves M.D.   On: 05/02/2017 11:06    Disposition: Final discharge disposition not confirmed    Follow-up Information    Loreta Ave, MD. Schedule an appointment as soon as possible for a visit in 2 week(s).   Specialty:  Orthopedic Surgery Contact information: [address] [place] [address] [phone]        [facility] Follow up.   Specialty:  Home Health Services Why:  A representative from [facility] will contact you to arrange start date and time for your therapy. Contact information: [address] [place] [address] [phone]            Signed: Otilio Saber 05/04/2017, 8:08 AM

## 2017-05-02 NOTE — Op Note (Signed)
NAME:  Natalie Franklin, Natalie Franklin NO.:  [id number]  MEDICAL RECORD NO.:  [id number]  LOCATION:  [facility]                         FACILITY:  [facility]  PHYSICIAN:  Loreta Ave, M.D. DATE OF BIRTH:  Sep 09, 1951  DATE OF PROCEDURE:  05/02/2017 DATE OF DISCHARGE:                              OPERATIVE REPORT   PREOPERATIVE DIAGNOSIS:  Right knee end-stage arthritis secondary to rheumatoid arthritis.  POSTOPERATIVE DIAGNOSIS:  Right knee end-stage arthritis secondary to rheumatoid arthritis.  Marked residual hypertrophic synovitis. Underlying osteopenia.  PROCEDURE:  Modified minimally invasive right total knee replacement utilizing Stryker triathlon prosthesis.  All components cemented.  A pegged cruciate retaining #3 femoral component.  #3 tibial component, 9- mm CS insert.  A 29 mm patellar component.  Also extensive synovectomy.  SURGEON:  Loreta Ave, M.D.  ASSISTANT:  Tessa Lerner, PA, present throughout the entire case and necessary for timely completion of procedure.  ANESTHESIA:  Spinal.  BLOOD LOSS:  Minimal.  SPECIMENS:  None.  CULTURES:  None.  COMPLICATIONS:  None.  DRESSINGS:  Sterile compressive, knee immobilizer.  TOURNIQUET TIME:  45 minutes.  DESCRIPTION OF PROCEDURE:  The patient was brought to operating room and after adequate anesthesia had been obtained, tourniquet applied. Prepped and draped in usual sterile fashion.  Exsanguinated with elevation of Esmarch.  Tourniquet inflated to 300 mmHg.  A straight incision above the patella down the tibial tubercle.  Medial arthrotomy, vastus splitting.  Marked residual hypertrophic synovitis throughout, treated with tricompartmental synovectomy.  Distal femoral resection. Flexible intramedullary guide, 5 degrees of valgus.  Using epicondylar axis, femur was sized, cut, and fitted for cruciate retaining #3 component.  Proximal tibial resection with extramedullary guide. Rotation set with  trials, hand reamed.  Patella exposed, posterior 9 mm removed.  Drilled, sized, and fitted for 29-mm component.  Copious irrigation of the knee.  Cement prepared and placed on all components, firmly seated.  Polyethylene attached to tibia, knee reduced.  Patella held with a clamp.  Once the cement hardened, the knee was irrigated again.  Soft tissue injected with Exparel.  Arthrotomy closed with #1 Vicryl with subcutaneous subcuticular closure.  At completion, excellent biomechanical axis, good tracking, good stability, nicely balanced knee.  Tourniquet removed.  A sterile compressive dressing applied.  Knee immobilizer applied. Anesthesia reversed.  Brought to the recovery room.  Tolerated the surgery well.  No complications.     Loreta Ave, M.D.     DFM/MEDQ  D:  05/02/2017  T:  05/02/2017  Job:  [id number]

## 2017-05-02 NOTE — Progress Notes (Signed)
Pt admitted to the unit from pacu; pt A&O x4; MAE x4; report decreased sensation with some tingling and numbness to RLE. RLE in CPM; pt due to void per report by 1630. IV intact and transfusing; skin intact with no wounds or pressure ulcer noted except for RLE incision with compression dsg clean, dry and intact. Pt oriented to the unit and room; fall/safety precaution and prevention education completed with pt and family at bedside. VSS; Call light within reach and will closely monitor pt. Dionne Bucy RN

## 2017-05-02 NOTE — Evaluation (Signed)
Physical Therapy Evaluation Patient Details Name: Natalie Franklin MRN: [id number] DOB: 31-Dec-1950 Today's Date: 05/02/2017   History of Present Illness  Pt is a 66 y/o female s/p elective R TKA. PMH includes RA and HTN. Pt previously presented to ED with LLE pain and dopplers negative for DVT, but did exhibit possible muscle tear.   Clinical Impression  Pt is s/p surgery above with deficits below. PTA, pt was independent with functional mobility. Upon eval, pt limited by post op pain and weakness, as well as, slightly decreased balance. Pt requiring min to min guard assist for mobility this session. Pt reports she will be getting HHPT services at d/c and husband and sister will be available to assist as needed upon d/c. Pt has all necessary DME at home. Will continue to follow acutely to maximize functional mobility independence and safety.     Follow Up Recommendations DC plan and follow up therapy as arranged by surgeon;Supervision for mobility/OOB    Equipment Recommendations  None recommended by PT (has all DMe )    Recommendations for Other Services       Precautions / Restrictions Precautions Precautions: Knee Precaution Booklet Issued: Yes (comment) Precaution Comments: Reviewed supine ther ex with pt  Restrictions Weight Bearing Restrictions: Yes RLE Weight Bearing: Weight bearing as tolerated      Mobility  Bed Mobility Overal bed mobility: Needs Assistance Bed Mobility: Supine to Sit;Sit to Supine     Supine to sit: Supervision Sit to supine: Supervision   General bed mobility comments: Supervision for safety. Use of bed rails and elevated HOB.   Transfers Overall transfer level: Needs assistance Equipment used: Rolling walker (2 wheeled) Transfers: Sit to/from Stand Sit to Stand: Min assist         General transfer comment: Min A for steadying. Verbal cues for safe hand placement.   Ambulation/Gait Ambulation/Gait assistance: Min guard Ambulation  Distance (Feet): 75 Feet Assistive device: Rolling walker (2 wheeled) Gait Pattern/deviations: Step-to pattern;Step-through pattern;Decreased step length - right;Decreased step length - left;Decreased weight shift to right;Antalgic Gait velocity: Decreased Gait velocity interpretation: Below normal speed for age/gender General Gait Details: Slow, slightly antalgic gait secondary to post op pain. Verbal cues for sequencing with RW and for upright posture. Verbal cues for knee flexion during swing phase on RLE.   Stairs            Wheelchair Mobility    Modified Rankin (Stroke Patients Only)       Balance Overall balance assessment: Needs assistance Sitting-balance support: No upper extremity supported;Feet supported Sitting balance-Leahy Scale: Good     Standing balance support: Bilateral upper extremity supported;During functional activity Standing balance-Leahy Scale: Poor Standing balance comment: Reliant on RW for stability                              Pertinent Vitals/Pain Pain Assessment: 0-10 Pain Score: 5  Pain Location: R knee  Pain Descriptors / Indicators: Aching;Operative site guarding Pain Intervention(s): Limited activity within patient's tolerance;Monitored during session;Repositioned    Home Living Family/patient expects to be discharged to:: Private residence Living Arrangements: Spouse/significant other Available Help at Discharge: Family;Available 24 hours/day Type of Home: House Home Access: Stairs to enter Entrance Stairs-Rails: Right Entrance Stairs-Number of Steps: 5 Home Layout: One level Home Equipment: Walker - 2 wheels;Bedside commode;Other (comment) (CPM)      Prior Function Level of Independence: Independent  Hand Dominance   Dominant Hand: Right    Extremity/Trunk Assessment   Upper Extremity Assessment Upper Extremity Assessment: Defer to OT evaluation    Lower Extremity Assessment Lower  Extremity Assessment: RLE deficits/detail RLE Deficits / Details: Sensory in tact. Deficits consistent with post op pain and weakness. Able to perform exercises below.     Cervical / Trunk Assessment Cervical / Trunk Assessment: Normal  Communication   Communication: No difficulties  Cognition Arousal/Alertness: Awake/alert Behavior During Therapy: WFL for tasks assessed/performed Overall Cognitive Status: Within Functional Limits for tasks assessed                                        General Comments General comments (skin integrity, edema, etc.): Pt's husband and sons present during session.     Exercises Total Joint Exercises Ankle Circles/Pumps: AROM;Both;10 reps;Supine Quad Sets: AROM;Right;10 reps;Supine Towel Squeeze: AROM;Both;10 reps;Supine Short Arc Quad: AROM;Right;10 reps;Supine Heel Slides: AROM;Right;10 reps;Supine Hip ABduction/ADduction: AROM;Right;10 reps;Supine   Assessment/Plan    PT Assessment Patient needs continued PT services  PT Problem List Decreased strength;Decreased range of motion;Decreased mobility;Decreased balance;Decreased coordination;Decreased knowledge of use of DME;Pain       PT Treatment Interventions DME instruction;Gait training;Stair training;Functional mobility training;Therapeutic activities;Therapeutic exercise;Balance training;Neuromuscular re-education;Patient/family education    PT Goals (Current goals can be found in the Care Plan section)  Acute Rehab PT Goals Patient Stated Goal: to go home  PT Goal Formulation: With patient Time For Goal Achievement: 05/09/17 Potential to Achieve Goals: Good    Frequency 7X/week   Barriers to discharge        Co-evaluation               AM-PAC PT "6 Clicks" Daily Activity  Outcome Measure Difficulty turning over in bed (including adjusting bedclothes, sheets and blankets)?: A Little Difficulty moving from lying on back to sitting on the side of the bed? :  A Little Difficulty sitting down on and standing up from a chair with arms (e.g., wheelchair, bedside commode, etc,.)?: Unable Help needed moving to and from a bed to chair (including a wheelchair)?: A Little Help needed walking in hospital room?: A Little Help needed climbing 3-5 steps with a railing? : A Little 6 Click Score: 16    End of Session Equipment Utilized During Treatment: Gait belt Activity Tolerance: Patient tolerated treatment well Patient left: in bed;with call bell/phone within reach;with family/visitor present Nurse Communication: Mobility status PT Visit Diagnosis: Other abnormalities of gait and mobility (R26.89);Pain Pain - Right/Left: Right Pain - part of body: Knee    Time: 1757-1830 PT Time Calculation (min) (ACUTE ONLY): 33 min   Charges:   PT Evaluation $PT Eval Low Complexity: 1 Low PT Treatments $Gait Training: 8-22 mins   PT G Codes:        Gladys Damme, PT, DPT  Acute Rehabilitation Services  Pager: [phone]   Lehman Prom 05/02/2017, 6:40 PM

## 2017-05-02 NOTE — Anesthesia Procedure Notes (Signed)
Anesthesia Regional Block: Adductor canal block   Pre-Anesthetic Checklist: ,, timeout performed, Correct Patient, Correct Site, Correct Laterality, Correct Procedure, Correct Position, site marked, Risks and benefits discussed,  Surgical consent,  Pre-op evaluation,  At surgeon's request and post-op pain management  Laterality: Right  Prep: chloraprep       Needles:  Injection technique: Single-shot  Needle Type: Echogenic Stimulator Needle     Needle Length: 5cm  Needle Gauge: 22     Additional Needles:   Procedures: ultrasound guided, nerve stimulator,,,,,,  Narrative:  Start time: 05/02/2017 8:10 AM End time: 05/02/2017 8:22 AM Injection made incrementally with aspirations every 5 mL.  Performed by: Personally  Anesthesiologist: Swayzie Choate  Additional Notes: Functioning IV was confirmed and monitors were applied.  A 42mm 22ga Arrow echogenic stimulator needle was used. Sterile prep and drape,hand hygiene and sterile gloves were used. Ultrasound guidance: relevant anatomy identified, needle position confirmed, local anesthetic spread visualized around nerve(s)., vascular puncture avoided.  Image printed for medical record. Negative aspiration and negative test dose prior to incremental administration of local anesthetic. The patient tolerated the procedure well.

## 2017-05-02 NOTE — Anesthesia Postprocedure Evaluation (Signed)
Anesthesia Post Note  Patient: Natalie Franklin  Procedure(s) Performed: Procedure(s) (LRB): TOTAL KNEE ARTHROPLASTY (Right)     Patient location during evaluation: PACU Anesthesia Type: Spinal Level of consciousness: oriented and awake and alert Pain management: pain level controlled Vital Signs Assessment: post-procedure vital signs reviewed and stable Respiratory status: spontaneous breathing, respiratory function stable and patient connected to nasal cannula oxygen Cardiovascular status: blood pressure returned to baseline and stable Postop Assessment: no headache and no backache Anesthetic complications: no    Last Vitals:  Vitals:   05/02/17 1100 05/02/17 1115  BP: (!) 145/86 140/82  Pulse:    Resp:    Temp:    SpO2:      Last Pain:  Vitals:   05/02/17 1115  TempSrc:   PainSc: 0-No pain                 Tyronica Truxillo

## 2017-05-03 ENCOUNTER — Encounter (HOSPITAL_COMMUNITY): Payer: Self-pay | Admitting: Orthopedic Surgery

## 2017-05-03 LAB — CBC
HCT: 30.1 % — ABNORMAL LOW (ref 36.0–46.0)
Hemoglobin: 9.7 g/dL — ABNORMAL LOW (ref 12.0–15.0)
MCH: 29 pg (ref 26.0–34.0)
MCHC: 32.2 g/dL (ref 30.0–36.0)
MCV: 89.9 fL (ref 78.0–100.0)
PLATELETS: 339 10*3/uL (ref 150–400)
RBC: 3.35 MIL/uL — ABNORMAL LOW (ref 3.87–5.11)
RDW: 14.5 % (ref 11.5–15.5)
WBC: 16.2 10*3/uL — ABNORMAL HIGH (ref 4.0–10.5)

## 2017-05-03 LAB — BASIC METABOLIC PANEL
ANION GAP: 5 (ref 5–15)
BUN: 5 mg/dL — ABNORMAL LOW (ref 6–20)
CO2: 23 mmol/L (ref 22–32)
Calcium: 8.4 mg/dL — ABNORMAL LOW (ref 8.9–10.3)
Chloride: 107 mmol/L (ref 101–111)
Creatinine, Ser: 0.47 mg/dL (ref 0.44–1.00)
GFR calc Af Amer: >60 mL/min (ref >60)
Glucose, Bld: 124 mg/dL — ABNORMAL HIGH (ref 65–99)
POTASSIUM: 4.1 mmol/L (ref 3.5–5.1)
Sodium: 135 mmol/L (ref 135–145)

## 2017-05-03 NOTE — Care Management Note (Signed)
Case Management Note  Patient Details  Name: Natalie Franklin MRN: [id number] Date of Birth: [birth year]  Subjective/Objective: 66 yr old female s/p right total knee arthroplasty.                    Action/Plan:  Patient was preoperatively setup with [facility], no changes. DME has been delivered. Patient will have assistance at discharge.   Expected Discharge Date:  05/03/17               Expected Discharge Plan:  Home w Home Health Services  In-House Referral:  NA  Discharge planning Services  [facility]  Post Acute Care Choice:  Home Health, Durable Medical Equipment Choice offered to:  Patient  DME Arranged:  3-N-1, CPM, Walker rolling DME Agency:  [employer]  HH Arranged:  PT HH Agency:  [facility]  Status of Service:  Completed, signed off  If discussed at [employer] [facility] [employer], dates discussed:    Additional Comments:  Durenda Guthrie, RN 05/03/2017, 4:00 PM

## 2017-05-03 NOTE — Evaluation (Signed)
Occupational Therapy Evaluation Patient Details Name: Natalie Franklin MRN: [id number] DOB: [birth year] Today's Date: 05/03/2017    History of Present Illness Pt is a 66 y/o female s/p elective R TKA. PMH includes RA and HTN. Pt previously presented to ED with LLE pain and dopplers negative for DVT, but did exhibit possible muscle tear.    Clinical Impression   Pt admitted with the above diagnoses and presents with below problem list. Pt will benefit from continued acute OT to address the below listed deficits and maximize independence with basic ADLs prior to d/c home. PTA pt was independent with ADLs. Pt is currently min guard with LB ADLS, toilet/shower transfers, and functional mobility.       Follow Up Recommendations  DC plan and follow up therapy as arranged by surgeon    Equipment Recommendations  3 in 1 bedside commode    Recommendations for Other Services       Precautions / Restrictions Precautions Precautions: Knee Precaution Comments: reviewed precautions Restrictions Weight Bearing Restrictions: Yes RLE Weight Bearing: Weight bearing as tolerated      Mobility Bed Mobility Overal bed mobility: Needs Assistance             General bed mobility comments: not observed.   Transfers Overall transfer level: Needs assistance Equipment used: Rolling walker (2 wheeled)             General transfer comment: from 3n1 and recliner.    Balance Overall balance assessment: Needs assistance Sitting-balance support: No upper extremity supported;Feet supported Sitting balance-Leahy Scale: Good     Standing balance support: Bilateral upper extremity supported;During functional activity Standing balance-Leahy Scale: Poor Standing balance comment: Reliant on RW for stability                            ADL either performed or assessed with clinical judgement   ADL Overall ADL's : Needs assistance/impaired Eating/Feeding: Set up;Sitting    Grooming: Min guard;Standing   Upper Body Bathing: Set up;Sitting   Lower Body Bathing: Min guard;Sit to/from stand   Upper Body Dressing : Set up;Sitting   Lower Body Dressing: Min guard;Sit to/from stand   Toilet Transfer: Min guard;Ambulation;RW (3n1 over toilet)   Toileting- Clothing Manipulation and Hygiene: Min guard;Sit to/from stand   Tub/ Shower Transfer: Tub transfer;Min guard;Ambulation;Shower seat;3 in Scientist, water quality Details (indicate cue type and reason): clinical judgement. handout given and technique reviewed and visually demonstrated with spouse present.  Functional mobility during ADLs: Min guard;Rolling walker General ADL Comments: Pt received in bathroom. Completed toilet transfer as detailed above. Ambulated back to chair. ADL toilet/tub transfer education given.      Vision         Perception     Praxis      Pertinent Vitals/Pain Pain Assessment: 0-10 Pain Score: 7  Pain Location: R knee  Pain Descriptors / Indicators: Aching;Operative site guarding Pain Intervention(s): Limited activity within patient's tolerance;Repositioned;Monitored during session;Patient requesting pain meds-RN notified;RN gave pain meds during session     Hand Dominance Right   Extremity/Trunk Assessment Upper Extremity Assessment Upper Extremity Assessment: Overall WFL for tasks assessed   Lower Extremity Assessment Lower Extremity Assessment: Defer to PT evaluation   Cervical / Trunk Assessment Cervical / Trunk Assessment: Normal   Communication Communication Communication: No difficulties   Cognition Arousal/Alertness: Awake/alert Behavior During Therapy: WFL for tasks assessed/performed Overall Cognitive Status: Within Functional Limits for tasks assessed  General Comments  Spouse present and involved in session.    Exercises     Shoulder Instructions      Home Living Family/patient  expects to be discharged to:: Private residence Living Arrangements: Spouse/significant other Available Help at Discharge: Family;Available 24 hours/day Type of Home: House Home Access: Stairs to enter [employer] of Steps: 5 Entrance Stairs-Rails: Right Home Layout: One level     Bathroom Shower/Tub: Chief Strategy Officer: Standard     Home Equipment: Environmental consultant - 2 wheels;Bedside commode;Other (comment)          Prior Functioning/Environment Level of Independence: Independent                 OT Problem List: Impaired balance (sitting and/or standing);Decreased knowledge of use of DME or AE;Decreased knowledge of precautions;Pain      OT Treatment/Interventions: Self-care/ADL training;DME and/or AE instruction;Therapeutic activities;Patient/family education;Balance training    OT Goals(Current goals can be found in the care plan section) Acute Rehab OT Goals Patient Stated Goal: to go home  OT Goal Formulation: With patient/family Time For Goal Achievement: 05/10/17 Potential to Achieve Goals: Good ADL Goals Pt Will Perform Lower Body Bathing: with modified independence;sit to/from stand Pt Will Perform Lower Body Dressing: with modified independence;sit to/from stand Pt Will Perform Tub/Shower Transfer: Tub transfer;with supervision;ambulating;shower seat;3 in 1;rolling walker  OT Frequency: Min 2X/week   Barriers to D/C:            Co-evaluation              AM-PAC PT "6 Clicks" Daily Activity     Outcome Measure Help from another person eating meals?: None Help from another person taking care of personal grooming?: None Help from another person toileting, which includes using toliet, bedpan, or urinal?: A Little Help from another person bathing (including washing, rinsing, drying)?: A Little Help from another person to put on and taking off regular upper body clothing?: None Help from another person to put on and taking off  regular lower body clothing?: A Little 6 Click Score: 21   End of Session Equipment Utilized During Treatment: Rolling walker CPM Right Knee CPM Right Knee: Off Right Knee Flexion (Degrees): 60 Right Knee Extension (Degrees): 0  Activity Tolerance: Patient limited by pain;Patient tolerated treatment well Patient left: Other (comment) (with PT)  OT Visit Diagnosis: Unsteadiness on feet (R26.81);Pain Pain - Right/Left: Right Pain - part of body: Knee                Time: 0160-1093 OT Time Calculation (min): 24 min Charges:  OT General Charges $OT Visit: 1 Procedure OT Evaluation $OT Eval Low Complexity: 1 Procedure G-Codes:       Pilar Grammes 05/03/2017, 9:41 AM

## 2017-05-03 NOTE — Progress Notes (Signed)
Orthopedic Tech Progress Note Patient Details:  Natalie Franklin August 18, 1951 [id number]  Patient ID: [name], female   DOB: 24-Jun-1951, 66 y.o.   MRN: [id number]   [name] 05/03/2017, 12:56 PM Placed pt's rle on cpm @0 -60 degrees @1255 ; RN notified

## 2017-05-03 NOTE — Progress Notes (Signed)
Orthopedic Tech Progress Note Patient Details:  Natalie Franklin [birth year] [id number]  Ortho Devices Ortho Device/Splint Location: on cpm at 1800   [name] 05/03/2017, 6:04 PM

## 2017-05-03 NOTE — Progress Notes (Signed)
Physical Therapy Treatment Patient Details Name: Natalie Franklin MRN: [id number] DOB: 05-Feb-1951 Today's Date: 05/03/2017    History of Present Illness Pt is a 66 y/o female s/p elective R TKA. PMH includes RA and HTN. Pt previously presented to ED with LLE pain and dopplers negative for DVT, but did exhibit possible muscle tear.     PT Comments    Pt tx session limited due to nauseous after receiving IV dilaudid in standing.  Pt quickly became nauseous and dizzy.  Pt returned to supine and attempted supine ex but only able to perform x 3 exercises before dry heaving.  Educated patient on food intake with meds.  Pt eating saltines and drinking gingerale.  Informed RN who brought antinausea meds.  Will follow up in pm for increased activity.     Follow Up Recommendations  DC plan and follow up therapy as arranged by surgeon;Supervision for mobility/OOB     Equipment Recommendations  None recommended by PT (Has all DME)    Recommendations for Other Services       Precautions / Restrictions Precautions Precautions: Knee Precaution Booklet Issued: Yes (comment) Precaution Comments: reviewed precautions Restrictions Weight Bearing Restrictions: Yes RLE Weight Bearing: Weight bearing as tolerated    Mobility  Bed Mobility Overal bed mobility: Needs Assistance Bed Mobility: Sit to Supine       Sit to supine: Min assist   General bed mobility comments: Pt required assist to lift LEs into bed against gravity.  Pt required quick transfer back to bed after becoming nauseous in standing following administration of IV dilaudid from RN.    Transfers Overall transfer level: Needs assistance Equipment used: Rolling walker (2 wheeled) Transfers: Sit to/from Stand Sit to Stand: Min assist         General transfer comment: Cues for hand placement to and from seated surface.  Pt with poor eccentric loading back to bed and required increased assistance to ensure safety.     Ambulation/Gait Ambulation/Gait assistance: Min guard Ambulation Distance (Feet): 12 Feet (gait limited to short distance.  pt able to ambulate to other side of the bed.  Nurse arrived and gave IV dilaudid, pt immediatley nauseous and had to sit down and return to supine in bed.  ) Assistive device: Rolling walker (2 wheeled) Gait Pattern/deviations: Step-to pattern;Trunk flexed;Antalgic;Decreased stride length Gait velocity: Decreased Gait velocity interpretation: Below normal speed for age/gender General Gait Details: Pt required cues for sequencing, heel strike on R, and Knee extension on R in stance phase.  Cues for knee flexion in swing phase.     Stairs            Wheelchair Mobility    Modified Rankin (Stroke Patients Only)       Balance Overall balance assessment: Needs assistance Sitting-balance support: No upper extremity supported;Feet supported Sitting balance-Leahy Scale: Good     Standing balance support: Bilateral upper extremity supported;During functional activity Standing balance-Leahy Scale: Poor Standing balance comment: Reliant on RW for stability                             Cognition Arousal/Alertness: Awake/alert Behavior During Therapy: WFL for tasks assessed/performed Overall Cognitive Status: Within Functional Limits for tasks assessed  Exercises Total Joint Exercises Ankle Circles/Pumps: AROM;Both;10 reps;Supine Quad Sets: AROM;Right;10 reps;Supine Heel Slides: AROM;Right;10 reps;Supine    General Comments General comments (skin integrity, edema, etc.): Spouse present and involved in session.      Pertinent Vitals/Pain Pain Assessment: 0-10 Pain Score: 7  Pain Location: R knee  Pain Descriptors / Indicators: Aching;Operative site guarding Pain Intervention(s): Monitored during session;Repositioned;Ice applied;Patient requesting pain meds-RN notified;RN gave pain  meds during session (Pt received IV dilaudid during tx.  )    Home Living Family/patient expects to be discharged to:: Private residence Living Arrangements: Spouse/significant other Available Help at Discharge: Family;Available 24 hours/day Type of Home: House Home Access: Stairs to enter Entrance Stairs-Rails: Right Home Layout: One level Home Equipment: Walker - 2 wheels;Bedside commode;Other (comment)      Prior Function Level of Independence: Independent          PT Goals (current goals can now be found in the care plan section) Acute Rehab PT Goals Patient Stated Goal: to go home  Potential to Achieve Goals: Good Progress towards PT goals: Progressing toward goals    Frequency    7X/week      PT Plan Current plan remains appropriate    Co-evaluation              AM-PAC PT "6 Clicks" Daily Activity  Outcome Measure  Difficulty turning over in bed (including adjusting bedclothes, sheets and blankets)?: A Little Difficulty moving from lying on back to sitting on the side of the bed? : Unable Difficulty sitting down on and standing up from a chair with arms (e.g., wheelchair, bedside commode, etc,.)?: Unable Help needed moving to and from a bed to chair (including a wheelchair)?: A Little Help needed walking in hospital room?: A Little Help needed climbing 3-5 steps with a railing? : A Lot 6 Click Score: 13    End of Session Equipment Utilized During Treatment: Gait belt Activity Tolerance: Patient tolerated treatment well Patient left: in bed;with call bell/phone within reach;with family/visitor present Nurse Communication: Mobility status PT Visit Diagnosis: Other abnormalities of gait and mobility (R26.89);Pain Pain - Right/Left: Right Pain - part of body: Knee     Time: 0973-5329 PT Time Calculation (min) (ACUTE ONLY): 24 min  Charges:  $Therapeutic Exercise: 8-22 mins $Therapeutic Activity: 8-22 mins                    G Codes:        Joycelyn Rua, PTA pager [phone]    Florestine Avers 05/03/2017, 9:52 AM

## 2017-05-03 NOTE — Progress Notes (Signed)
Subjective: 1 Day Post-Op Procedure(s) (LRB): TOTAL KNEE ARTHROPLASTY (Right) Patient reports pain as mild.  Patient doing well this am  Objective: Vital signs in last 24 hours: Temp:  [97.7 F (36.5 C)-98.7 F (37.1 C)] 98.4 F (36.9 C) (08/23 0601) Pulse Rate:  [55-80] 58 (08/23 0601) Resp:  [13-18] 18 (08/23 0601) BP: (108-145)/(55-88) 120/59 (08/23 0601) SpO2:  [95 %-100 %] 97 % (08/23 0601)  Intake/Output from previous day: 08/22 0701 - 08/23 0700 In: 2451.7 [P.O.:240; I.V.:2161.7; IV Piggyback:50] Out: 1210 [Urine:1200; Blood:10] Intake/Output this shift: No intake/output data recorded.   Recent Labs  05/02/17 0727 05/03/17 0416  HGB 12.6 9.7*    Recent Labs  05/02/17 0727 05/03/17 0416  WBC 14.6* 16.2*  RBC 4.39 3.35*  HCT 38.7 30.1*  PLT 425* 339    Recent Labs  05/03/17 0416  NA 135  K 4.1  CL 107  CO2 23  BUN 5*  CREATININE 0.47  GLUCOSE 124*  CALCIUM 8.4*   No results for input(s): LABPT, INR in the last 72 hours.  Neurologically intact Neurovascular intact Sensation intact distally Intact pulses distally Dorsiflexion/Plantar flexion intact Incision: dressing C/D/I No cellulitis present Compartment soft  Assessment/Plan: 1 Day Post-Op Procedure(s) (LRB): TOTAL KNEE ARTHROPLASTY (Right) Advance diet Up with therapy D/C IV fluids Discharge home with home health after second session of PT as long as she mobilizes well. WBAT RLE ABLA-mild and stable  Otilio Saber 05/03/2017, 7:40 AM

## 2017-05-03 NOTE — Progress Notes (Signed)
Physical Therapy Treatment Patient Details Name: Natalie Franklin MRN: [id number] DOB: [birth year] Today's Date: 05/03/2017    History of Present Illness Pt is a 66 y/o female s/p elective R TKA. PMH includes RA and HTN. Pt previously presented to ED with LLE pain and dopplers negative for DVT, but did exhibit possible muscle tear.     PT Comments    Pt performed gait but remains nauseous during session.  No vomitting during session but reports c/o.  Pt performed LE exercise and ROM obtained.  Plan for stair training in am.     Follow Up Recommendations  DC plan and follow up therapy as arranged by surgeon;Supervision for mobility/OOB     Equipment Recommendations   (Has all DME)    Recommendations for Other Services       Precautions / Restrictions Precautions Precautions: Knee Precaution Booklet Issued: Yes (comment) Precaution Comments: reviewed precautions Restrictions Weight Bearing Restrictions: Yes RLE Weight Bearing: Weight bearing as tolerated    Mobility  Bed Mobility Overal bed mobility: Needs Assistance Bed Mobility: Sit to Supine;Supine to Sit     Supine to sit: Supervision Sit to supine: Supervision   General bed mobility comments: Cues for sequencing, pt reports dizziness upon sitting edge of bed required cues to slow movement between transitions.    Transfers Overall transfer level: Needs assistance Equipment used: Rolling walker (2 wheeled) Transfers: Sit to/from Stand Sit to Stand: Min guard         General transfer comment: Cues for hand placement to and from seated surface. Better carryover this pm.    Ambulation/Gait Ambulation/Gait assistance: Min guard Ambulation Distance (Feet): 200 Feet Assistive device: Rolling walker (2 wheeled) Gait Pattern/deviations: Step-through pattern;Trunk flexed;Decreased stride length Gait velocity: Decreased Gait velocity interpretation: Below normal speed for age/gender General Gait Details: Cues for RW  safety. gait symmetry and R knee extension in stance phase.  Buckling x1 during gait training.     Stairs            Wheelchair Mobility    Modified Rankin (Stroke Patients Only)       Balance Overall balance assessment: Needs assistance   Sitting balance-Leahy Scale: Good       Standing balance-Leahy Scale: Poor                              Cognition Arousal/Alertness: Awake/alert Behavior During Therapy: WFL for tasks assessed/performed Overall Cognitive Status: Within Functional Limits for tasks assessed                                        Exercises Total Joint Exercises Ankle Circles/Pumps: AROM;Both;10 reps;Supine Quad Sets: AROM;Right;10 reps;Supine Towel Squeeze: AROM;Both;10 reps;Supine Short Arc Quad: AROM;Right;10 reps;Supine Heel Slides: AROM;Right;10 reps;Supine Hip ABduction/ADduction: AROM;Right;10 reps;Supine Straight Leg Raises: AROM;Right;10 reps;Supine Goniometric ROM: 8-88 degrees flexion in R knee.    General Comments        Pertinent Vitals/Pain Pain Assessment: 0-10 Pain Score: 8  Pain Location: R knee  Pain Descriptors / Indicators: Aching;Operative site guarding Pain Intervention(s): Monitored during session;Repositioned;Ice applied    Home Living                      Prior Function            PT Goals (current goals can now be found  in the care plan section) Acute Rehab PT Goals Patient Stated Goal: to go home  Potential to Achieve Goals: Good Progress towards PT goals: Progressing toward goals    Frequency    7X/week      PT Plan Current plan remains appropriate    Co-evaluation              AM-PAC PT "6 Clicks" Daily Activity  Outcome Measure  Difficulty turning over in bed (including adjusting bedclothes, sheets and blankets)?: A Little Difficulty moving from lying on back to sitting on the side of the bed? : A Little Difficulty sitting down on and standing up  from a chair with arms (e.g., wheelchair, bedside commode, etc,.)?: A Little Help needed moving to and from a bed to chair (including a wheelchair)?: A Little Help needed walking in hospital room?: A Little Help needed climbing 3-5 steps with a railing? : A Lot 6 Click Score: 17    End of Session Equipment Utilized During Treatment: Gait belt Activity Tolerance: Patient tolerated treatment well Patient left: in bed;with call bell/phone within reach;with family/visitor present Nurse Communication: Mobility status PT Visit Diagnosis: Other abnormalities of gait and mobility (R26.89);Pain Pain - Right/Left: Right Pain - part of body: Knee     Time: 1660-6301 PT Time Calculation (min) (ACUTE ONLY): 25 min  Charges:  $Gait Training: 8-22 mins $Therapeutic Exercise: 8-22 mins                    G Codes:       Joycelyn Rua, PTA pager [phone]    Florestine Avers 05/03/2017, 3:31 PM

## 2017-05-04 LAB — CBC
HEMATOCRIT: 33.1 % — AB (ref 36.0–46.0)
Hemoglobin: 10.2 g/dL — ABNORMAL LOW (ref 12.0–15.0)
MCH: 27.6 pg (ref 26.0–34.0)
MCHC: 30.8 g/dL (ref 30.0–36.0)
MCV: 89.5 fL (ref 78.0–100.0)
Platelets: 362 10*3/uL (ref 150–400)
RBC: 3.7 MIL/uL — ABNORMAL LOW (ref 3.87–5.11)
RDW: 14.2 % (ref 11.5–15.5)
WBC: 13.5 10*3/uL — ABNORMAL HIGH (ref 4.0–10.5)

## 2017-05-04 LAB — BASIC METABOLIC PANEL
Anion gap: 7 (ref 5–15)
BUN: 5 mg/dL — AB (ref 6–20)
CALCIUM: 8.4 mg/dL — AB (ref 8.9–10.3)
CO2: 25 mmol/L (ref 22–32)
CREATININE: 0.58 mg/dL (ref 0.44–1.00)
Chloride: 105 mmol/L (ref 101–111)
GFR calc Af Amer: >60 mL/min (ref >60)
GFR calc non Af Amer: >60 mL/min (ref >60)
GLUCOSE: 99 mg/dL (ref 65–99)
Potassium: 3.8 mmol/L (ref 3.5–5.1)
Sodium: 137 mmol/L (ref 135–145)

## 2017-05-04 NOTE — Progress Notes (Addendum)
Pt & husband both became suddenly impatient/verbally aggressive with this RN due to wait time on discharge paperwork. Explained to both that this RN had multiple discharges this morning and was doing the best I can. Pt immediately dismissed RN & continued complaining about wait time, dislike for lunch tray, etc. Respectfully excused myself and notified charge RN, Bjorn Loser. Bjorn Loser, RN discharged pt to home.

## 2017-05-04 NOTE — Progress Notes (Signed)
Subjective: 2 Days Post-Op Procedure(s) (LRB): TOTAL KNEE ARTHROPLASTY (Right) Patient reports pain as mild.    Objective: Vital signs in last 24 hours: Temp:  [98.1 F (36.7 C)-98.7 F (37.1 C)] 98.1 F (36.7 C) (08/24 0537) Pulse Rate:  [61-72] 72 (08/24 0537) Resp:  [18-20] 18 (08/24 0537) BP: (119-126)/(58-65) 124/58 (08/24 0537) SpO2:  [97 %-100 %] 97 % (08/24 0537)  Intake/Output from previous day: 08/23 0701 - 08/24 0700 In: 2493.3 [P.O.:840; I.V.:1653.3] Out: -  Intake/Output this shift: No intake/output data recorded.   Recent Labs  05/02/17 0727 05/03/17 0416 05/04/17 0649  HGB 12.6 9.7* 10.2*    Recent Labs  05/03/17 0416 05/04/17 0649  WBC 16.2* 13.5*  RBC 3.35* 3.70*  HCT 30.1* 33.1*  PLT 339 362    Recent Labs  05/03/17 0416  NA 135  K 4.1  CL 107  CO2 23  BUN 5*  CREATININE 0.47  GLUCOSE 124*  CALCIUM 8.4*   No results for input(s): LABPT, INR in the last 72 hours.  Neurologically intact Neurovascular intact Sensation intact distally Intact pulses distally Dorsiflexion/Plantar flexion intact Incision: dressing C/D/I No cellulitis present Compartment soft  Assessment/Plan: 2 Days Post-Op Procedure(s) (LRB): TOTAL KNEE ARTHROPLASTY (Right) Advance diet Up with therapy D/C IV fluids Discharge home with home health after first or second session of PT.  Depending on how well patient mobilizes and what she wishes to do. WBAT RLE ABLA-mild and stable PLEASE REMOVE ACE BANDAGE AND APPLY TED HOSE PRIOR TO D/C!  Otilio Saber 05/04/2017, 8:29 AM

## 2017-05-04 NOTE — Progress Notes (Addendum)
Physical Therapy Treatment Patient Details Name: Natalie Franklin MRN: [id number] DOB: [birth year] Today's Date: 05/04/2017    History of Present Illness Pt is a 66 y/o female s/p elective R TKA. PMH includes RA and HTN. Pt previously presented to ED with LLE pain and dopplers negative for DVT, but did exhibit possible muscle tear.     PT Comments    Pt performed increased gait and functional mobility during session.  Pt able to progress to stair training and RN informed patient is ready for d/c home at this time from a mobility standpoint.     Follow Up Recommendations  DC plan and follow up therapy as arranged by surgeon;Supervision for mobility/OOB     Equipment Recommendations   (Has all DME)    Recommendations for Other Services       Precautions / Restrictions Precautions Precautions: Knee Precaution Booklet Issued: Yes (comment) Precaution Comments: reviewed precautions Restrictions Weight Bearing Restrictions: Yes RLE Weight Bearing: Weight bearing as tolerated    Mobility  Bed Mobility Overal bed mobility: Needs Assistance Bed Mobility: Supine to Sit     Supine to sit: Supervision     General bed mobility comments: Supervision for safety with cues to slow transitional movement as this tends to make her dizzy.    Transfers Overall transfer level: Needs assistance Equipment used: Rolling walker (2 wheeled) Transfers: Sit to/from Stand Sit to Stand: Supervision         General transfer comment: Cues for hand placement to and from seated surface.   Ambulation/Gait Ambulation/Gait assistance: Supervision Ambulation Distance (Feet): 250 Feet Assistive device: Rolling walker (2 wheeled) Gait Pattern/deviations: Step-through pattern;Trunk flexed;Decreased stride length Gait velocity: Decreased   General Gait Details: Cues for RW safety. gait symmetry and R knee extension in stance phase.     Stairs Stairs: Yes  min guard Stair Management:  Forwards;Sideways;One rail Right Number of Stairs: 6 General stair comments: Cues for sequencing and hand placement on rails.  Pt's husband present during stair training to observe technique.  Education provided on setup of RW at top of the stair to ease transition after stair negotiation.    Wheelchair Mobility    Modified Rankin (Stroke Patients Only)       Balance     Sitting balance-Leahy Scale: Good       Standing balance-Leahy Scale: Fair                              Cognition Arousal/Alertness: Awake/alert Behavior During Therapy: WFL for tasks assessed/performed Overall Cognitive Status: Within Functional Limits for tasks assessed                                        Exercises Total Joint Exercises Ankle Circles/Pumps: AROM;Both;10 reps;Supine Quad Sets: AROM;Right;10 reps;Supine Towel Squeeze: AROM;Both;10 reps;Supine Short Arc Quad: AROM;Right;10 reps;Supine Heel Slides: AROM;Right;10 reps;Supine Hip ABduction/ADduction: AROM;Right;10 reps;Supine Straight Leg Raises: AROM;Right;10 reps;Supine Goniometric ROM: 3-91 degrees flexion in R knee.      General Comments        Pertinent Vitals/Pain Pain Assessment: 0-10 Pain Score: 6  Pain Location: R knee  Pain Descriptors / Indicators: Aching;Operative site guarding Pain Intervention(s): Monitored during session;Repositioned;Ice applied    Home Living  Prior Function            PT Goals (current goals can now be found in the care plan section) Acute Rehab PT Goals Patient Stated Goal: to go home  Potential to Achieve Goals: Good Progress towards PT goals: Progressing toward goals    Frequency    7X/week      PT Plan Current plan remains appropriate    Co-evaluation              AM-PAC PT "6 Clicks" Daily Activity  Outcome Measure  Difficulty turning over in bed (including adjusting bedclothes, sheets and blankets)?: A  Little Difficulty moving from lying on back to sitting on the side of the bed? : A Little Difficulty sitting down on and standing up from a chair with arms (e.g., wheelchair, bedside commode, etc,.)?: A Little Help needed moving to and from a bed to chair (including a wheelchair)?: A Little Help needed walking in hospital room?: A Little Help needed climbing 3-5 steps with a railing? : A Little 6 Click Score: 18    End of Session Equipment Utilized During Treatment: Gait belt Activity Tolerance: Patient tolerated treatment well Patient left: in bed;with call bell/phone within reach;with family/visitor present Nurse Communication: Mobility status PT Visit Diagnosis: Other abnormalities of gait and mobility (R26.89);Pain Pain - Right/Left: Right Pain - part of body: Knee     Time: 7494-4967 PT Time Calculation (min) (ACUTE ONLY): 25 min  Charges:  $Gait Training: 8-22 mins $Therapeutic Exercise: 8-22 mins                    G Codes:       Joycelyn Rua, PTA pager [phone]    Florestine Avers 05/04/2017, 1:32 PM

## 2017-06-15 ENCOUNTER — Other Ambulatory Visit (HOSPITAL_COMMUNITY): Payer: BLUE CROSS/BLUE SHIELD

## 2017-06-19 ENCOUNTER — Other Ambulatory Visit (HOSPITAL_COMMUNITY): Payer: Self-pay | Admitting: Orthopedic Surgery

## 2017-06-19 DIAGNOSIS — R52 Pain, unspecified: Secondary | ICD-10-CM

## 2017-06-20 ENCOUNTER — Ambulatory Visit (HOSPITAL_COMMUNITY)
Admission: RE | Admit: 2017-06-20 | Discharge: 2017-06-20 | Disposition: A | Discharge status: Home / Self Care | Payer: BLUE CROSS/BLUE SHIELD | Source: Ambulatory Visit | Attending: Internal Medicine | Admitting: Internal Medicine

## 2017-06-20 DIAGNOSIS — M7989 Other specified soft tissue disorders: Secondary | ICD-10-CM | POA: Insufficient documentation

## 2017-06-20 DIAGNOSIS — R52 Pain, unspecified: Secondary | ICD-10-CM

## 2017-06-20 DIAGNOSIS — M79604 Pain in right leg: Secondary | ICD-10-CM | POA: Diagnosis present

## 2017-06-26 ENCOUNTER — Inpatient Hospital Stay: Admit: 2017-06-26 | Payer: BLUE CROSS/BLUE SHIELD | Admitting: Orthopedic Surgery

## 2017-06-26 SURGERY — ARTHROPLASTY, KNEE, TOTAL
Anesthesia: Choice | Laterality: Left

## 2017-06-28 NOTE — Pre-Procedure Instructions (Signed)
Natalie Franklin  06/28/2017      [id number] - [name], [place] - [address] AT [facility] [address] [place] [address] Phone: [phone] Fax: [phone]    Your procedure is scheduled on October 30  Report to [facility] Admitting at [phone] A.M.  Call this number if you have problems the morning of surgery:  [phone]   Remember:  Do not eat food or drink liquids after midnight.  Continue all other medications as directed by your physician except follow these medication instructions before surgery   Take these medicines the morning of surgery with A SIP OF WATER levothyroxine (SYNTHROID, LEVOTHROID loratadine (CLARITIN) LINZESS predniSONE (DELTASONE) PROAIR HFA    Do not wear jewelry, make-up or nail polish.  Do not wear lotions, powders, or perfumes, or deoderant.  Do not shave 48 hours prior to surgery.  Men may shave face and neck.  Do not bring valuables to the hospital.  [facility] is not responsible for any belongings or valuables.  Contacts, dentures or bridgework may not be worn into surgery.  Leave your suitcase in the car.  After surgery it may be brought to your room.  For patients admitted to the hospital, discharge time will be determined by your treatment team.  Patients discharged the day of surgery will not be allowed to drive home.    Special instructions:   [facility]- Preparing For Surgery  Before surgery, you can play an important role. Because skin is not sterile, your skin needs to be as free of germs as possible. You can reduce the number of germs on your skin by washing with CHG (chlorahexidine gluconate) Soap before surgery.  CHG is an antiseptic cleaner which kills germs and bonds with the skin to continue killing germs even after washing.  Please do not use if you have an allergy to CHG or antibacterial soaps. If your skin becomes reddened/irritated stop using the CHG.  Do not shave  (including legs and underarms) for at least 48 hours prior to first CHG shower. It is OK to shave your face.  Please follow these instructions carefully.   1. Shower the NIGHT BEFORE SURGERY and the MORNING OF SURGERY with CHG.   2. If you chose to wash your hair, wash your hair first as usual with your normal shampoo.  3. After you shampoo, rinse your hair and body thoroughly to remove the shampoo.  4. Use CHG as you would any other liquid soap. You can apply CHG directly to the skin and wash gently with a scrungie or a clean washcloth.   5. Apply the CHG Soap to your body ONLY FROM THE NECK DOWN.  Do not use on open wounds or open sores. Avoid contact with your eyes, ears, mouth and genitals (private parts). Wash Face and genitals (private parts)  with your normal soap.  6. Wash thoroughly, paying special attention to the area where your surgery will be performed.  7. Thoroughly rinse your body with warm water from the neck down.  8. DO NOT shower/wash with your normal soap after using and rinsing off the CHG Soap.  9. Pat yourself dry with a CLEAN TOWEL.  10. Wear CLEAN PAJAMAS to bed the night before surgery, wear comfortable clothes the morning of surgery  11. Place CLEAN SHEETS on your bed the night of your first shower and DO NOT SLEEP WITH PETS.    Day  of Surgery: Do not apply any deodorants/lotions. Please wear clean clothes to the hospital/surgery center.      Please read over the following fact sheets that you were given.

## 2017-06-29 ENCOUNTER — Encounter (HOSPITAL_COMMUNITY)
Admission: RE | Admit: 2017-06-29 | Discharge: 2017-06-29 | Disposition: A | Discharge status: Home / Self Care | Payer: BLUE CROSS/BLUE SHIELD | Source: Ambulatory Visit | Attending: Orthopedic Surgery | Admitting: Orthopedic Surgery

## 2017-06-29 ENCOUNTER — Encounter (HOSPITAL_COMMUNITY): Payer: Self-pay

## 2017-06-29 DIAGNOSIS — Z01818 Encounter for other preprocedural examination: Secondary | ICD-10-CM | POA: Insufficient documentation

## 2017-06-29 DIAGNOSIS — Z79899 Other long term (current) drug therapy: Secondary | ICD-10-CM | POA: Insufficient documentation

## 2017-06-29 DIAGNOSIS — Z7982 Long term (current) use of aspirin: Secondary | ICD-10-CM | POA: Diagnosis not present

## 2017-06-29 DIAGNOSIS — M1712 Unilateral primary osteoarthritis, left knee: Secondary | ICD-10-CM | POA: Insufficient documentation

## 2017-06-29 HISTORY — DX: Chronic obstructive pulmonary disease, unspecified: J44.9

## 2017-06-29 LAB — BASIC METABOLIC PANEL
ANION GAP: 8 (ref 5–15)
BUN: 6 mg/dL (ref 6–20)
CO2: 23 mmol/L (ref 22–32)
Calcium: 9.2 mg/dL (ref 8.9–10.3)
Chloride: 105 mmol/L (ref 101–111)
Creatinine, Ser: 0.54 mg/dL (ref 0.44–1.00)
GFR calc Af Amer: >60 mL/min (ref >60)
GFR calc non Af Amer: >60 mL/min (ref >60)
GLUCOSE: 86 mg/dL (ref 65–99)
POTASSIUM: 3.8 mmol/L (ref 3.5–5.1)
Sodium: 136 mmol/L (ref 135–145)

## 2017-06-29 LAB — CBC
HEMATOCRIT: 42.2 % (ref 36.0–46.0)
HEMOGLOBIN: 13.4 g/dL (ref 12.0–15.0)
MCH: 29.1 pg (ref 26.0–34.0)
MCHC: 31.8 g/dL (ref 30.0–36.0)
MCV: 91.5 fL (ref 78.0–100.0)
Platelets: 435 10*3/uL — ABNORMAL HIGH (ref 150–400)
RBC: 4.61 MIL/uL (ref 3.87–5.11)
RDW: 15 % (ref 11.5–15.5)
WBC: 14.6 10*3/uL — ABNORMAL HIGH (ref 4.0–10.5)

## 2017-06-29 LAB — SURGICAL PCR SCREEN
MRSA, PCR: NEGATIVE
Staphylococcus aureus: NEGATIVE

## 2017-06-29 NOTE — Pre-Procedure Instructions (Addendum)
Natalie Franklin  06/29/2017      [id number] - [name], [place] - [address] AT [facility] OF [address] [address] [place] [address] Phone: [phone] Fax: [phone]    Your procedure is scheduled on October 30  Report to [facility] Admitting at [phone] A.M.  Call this number if you have problems the morning of surgery:  [phone]   Remember:  Do not eat food or drink liquids after midnight.  Continue all other medications as directed by your physician except follow these medication instructions before surgery   Take these medicines the morning of surgery with A SIP OF WATER levothyroxine (SYNTHROID, LEVOTHROID loratadine (CLARITIN) LINZESS predniSONE (DELTASONE) PROAIR HFA bring with you  STOP all herbel meds, nsaids (aleve,naproxen,advil,ibuprofen)7days prior to surgery starting 07/03/17 including  all vitamins/supplements,aspirin,folic acid,biotin,fish oil   Do not wear jewelry, make-up or nail polish.  Do not wear lotions, powders, or perfumes, or deoderant.  Do not shave 48 hours prior to surgery.  Men may shave face and neck.  Do not bring valuables to the hospital.  [facility] is not responsible for any belongings or valuables.  Contacts, dentures or bridgework may not be worn into surgery.  Leave your suitcase in the car.  After surgery it may be brought to your room.  For patients admitted to the hospital, discharge time will be determined by your treatment team.  Patients discharged the day of surgery will not be allowed to drive home.    Special instructions:   Natalie Franklin- Preparing For Surgery  Before surgery, you can play an important role. Because skin is not sterile, your skin needs to be as free of germs as possible. You can reduce the number of germs on your skin by washing with CHG (chlorahexidine gluconate) Soap before surgery.  CHG is an antiseptic cleaner which kills germs and bonds with the skin to  continue killing germs even after washing.  Please do not use if you have an allergy to CHG or antibacterial soaps. If your skin becomes reddened/irritated stop using the CHG.  Do not shave (including legs and underarms) for at least 48 hours prior to first CHG shower. It is OK to shave your face.  Please follow these instructions carefully.   1. Shower the NIGHT BEFORE SURGERY and the MORNING OF SURGERY with CHG.   2. If you chose to wash your hair, wash your hair first as usual with your normal shampoo.  3. After you shampoo, rinse your hair and body thoroughly to remove the shampoo.  4. Use CHG as you would any other liquid soap. You can apply CHG directly to the skin and wash gently with a scrungie or a clean washcloth.   5. Apply the CHG Soap to your body ONLY FROM THE NECK DOWN.  Do not use on open wounds or open sores. Avoid contact with your eyes, ears, mouth and genitals (private parts). Wash Face and genitals (private parts)  with your normal soap.  6. Wash thoroughly, paying special attention to the area where your surgery will be performed.  7. Thoroughly rinse your body with warm water from the neck down.  8. DO NOT shower/wash with your normal soap after using and rinsing off the CHG Soap.  9. Pat yourself dry with a CLEAN TOWEL.  10. Wear CLEAN PAJAMAS to bed the night before surgery, wear comfortable clothes the morning of surgery  11. Place CLEAN  SHEETS on your bed the night of your first shower and DO NOT SLEEP WITH PETS.    Day of Surgery: Do not apply any deodorants/lotions. Please wear clean clothes to the hospital/surgery center.      Please read over the following fact sheets that you were given.

## 2017-07-03 NOTE — Progress Notes (Addendum)
Anesthesia Chart Review:  Pt is a 66 year old female scheduled for L total knee arthroplasty on 07/10/2017 with Margarita Rana, MD  - PCP is Levan Hurst, MD (notes in care everywhere)  - Rheumatologist is Alben Deeds, MD - Pulmonologist is Randolm Idol, MD (notes in care everywhere)   PMH includes:  HTN, RA, hypothyroidism, COPD (possible mild), GERD. Former smoker. BMI 18. S/p R TKA 05/02/17  Medications include: golimumab, levothyroxine, methrotrexate (on hold for surgery), prednisone.  BP 133/85   Pulse 74   Temp 36.9 C   Resp 20   Ht 5\' 2"  (1.575 m)   Wt 100 lb 6.4 oz (45.5 kg)   SpO2 100%   BMI 18.36 kg/m     Preoperative labs reviewed.   - WBC 14.6.  Pt has had intermittently elevated WBC counts in the past (see care everywhere) and saw hematologist , MD 07/17/16 for leukocytosis; Dr. 13/6/17 felt WBC increase was due either to RA process or prednisone use.    CXR 06/24/16 (care everywhere): COPD changes noted. Negative acute.   EKG 06/07/17: sinus rhythm  PFTs 06/12/17 (care everywhere): - There is a mild fixed obstructive ventilatory defect. Hyperinflation and air trapping are not seen. The diffusing capacity is normal. No significant response to bronchodilators.   Pt tolerated R TKA in August. If no changes, I anticipate pt can proceed with surgery as scheduled.   September, FNP-BC [facility] Short Stay Surgical Center/Anesthesiology Phone: [phone] 07/03/2017 4:07 PM

## 2017-07-06 ENCOUNTER — Encounter (HOSPITAL_COMMUNITY): Payer: Self-pay | Admitting: Orthopedic Surgery

## 2017-07-06 NOTE — OR Nursing (Signed)
Addendum created to reflect correct Rec Care Complete and Proc Care Complete Times

## 2017-07-09 MED ORDER — TRANEXAMIC ACID 1000 MG/10ML IV SOLN
1000.0000 mg | INTRAVENOUS | Status: AC
  Administered 2017-07-10: 1000 mg via INTRAVENOUS
  Filled 2017-07-09: qty 10

## 2017-07-09 NOTE — H&P (Signed)
[facility] [address]   [address] [place]ieta Loveless [place] [address] [phone] A Division of [facility], [facility]  RE: PIPER, HASSEBROCK    [id number]   10-11-2050  06-11-17 Reason for visit: Continued evaluation left knee arthritis.   History of present illness: Ms. Chatterjee is a 66 year-old female with a history of RA and hypothyroidism who presents for continued evaluation of osteoarthritis of her left knee.  She has a history of pain and functional disability in the left knee due to arthritis and has failed non-surgical conservative treatments for greater than 12 weeks to include activity modification and anti-inflammatory medicines.  Onset of her symptoms has been gradual starting over a year ago and has worsened over that time.  She currently rates her pain at a 6/10 with activity.  She has night pain, worsening pain with activity and weight bearing and pain that interferes with her ADLs.  X-rays of her left knee show end stage degenerative disease.  She is status post right total knee arthroplasty this year by Dr. Richardson Landry.  There is no active infection.    Past medical history: RA and hypothyroidism.  Past surgical history: Right total knee arthroplasty on June 02, 2017, right wrist surgery in April of 2015, left foot surgery in 2010, right knee arthroscopy in 2006 and appendectomy in 1973.  Allergies: Dilaudid and Oxycodone cause significant sedation.    Current medications: Methotrexate 5/2.5 weekly, Levothyroxine 0.88 mg a day, folic acid 1 mg q day, Loratadine 10 mg p.r.n., Linzess 290 mg q day, Simponi infusion q 8 weeks, Prednisone 5 mg p.r.n.    Review of systems: Mild cataracts, occasional ringing in ears, intermittent constipation, intermittent ankle swelling, history of BPD and appetite loss.  Patient currently denies lightheadedness, fevers, chills, chest pain or shortness of breath.  No personal history of DVT, PE, MI  or CVA.  No loose teeth or dentures.  She recently had a successful root canal procedure and has completed antibiotics for same.  She will follow up with her provider for this one week prior to surgery.  All other systems have been reviewed and are otherwise currently negative with the exception of those mentioned in the HPI as above.  Family history: Mother, father and sister with cancer.    Social history: She is a former smoker-one pack per day x 12 years, quit 4 years ago.  No alcohol.  She is married and lives in a single story home with five steps.     EXAMINATION: Height: 5?3.  Weight: 100 pounds.  Blood pressure: 116/81.  Pulse: 76.  Temperature: 97.9.  O2 SAT: 98% on room air.  She is alert and in no acute distress.  Mildly antalgic gait without aid.  EOMI.  Good neck extension.  No increased work with breathing.  Lungs with bilateral faint wheeze anterior and posterior.  Slightly improved with cough.  No crackles at bases.  Heart: Regular rate and rhythm without murmur appreciated.  Abdomen: Soft, non-distended, non-tender.  Neuro: Without gross focal deficit.  Sensation is intact distally.  No lesions in the area of chief complaint.  Left knee has full active range of motion.  Joint line tenderness.  No lesions or signs of infection.  Stable to varus and valgus stress.  Neurovascularly intact distally.  X-RAYS: Previous four views of the left knee show end stage degenerative joint disease.    ASSESSMENT/PLAN: The patient's history, physical exam, clinical judgment of the provider and imaging  studies are all consistent with end stage degenerative joint disease and total joint arthroplasty is deemed medically necessary.  Treatment options including medical management, injection therapy and arthroplasty were all discussed at length.  The risks and benefits of the procedure were presented and reviewed.  The risk of non-operative treatment versus surgical intervention including, but not limited  to, continued pain, aseptic loosening, stiffness, dislocation/subluxation, infection, bleeding, nerve injury, blood clots, cardiopulmonary complications, morbidity, mortality, among others were discussed.  The patient verbalized understanding and wishes to proceed.  The patient will be admitted for inpatient treatment for surgery, pain control, physical therapy, OT, prophylactic antibiotics, VTE prophylaxis, progress with ambulation and ADLs and discharge planning.  Dental prophylaxis discussed and recommended for two years postoperatively.     The patient does meet criteria for Tranexamic acid which we will use perioperatively.  Aspirin 325 mg will be used postoperatively for DVT prophylaxis, in addition to SCDs and early ambulation.    The patient is planning to be discharged home in the care of her husband.  Patient is currently being treated for an upper respiratory infection and does have pulmonary clearance, visit and evaluation pending.    Pulmonology recommends stress dose steroids / Nebs perioperatively, alburterol prn.  Methotrexate and Simponi currently on hold and will be held for two weeks postoperatively.    Plan for Norco for pain medicine postoperatively.    Jewel Baize.  Eulah Pont, M.D.  Dictated by: Avis Epley, PA-C Electronically verified by Jewel Baize Eulah Pont, M.D. TDM(HCM):jjh D 06-11-17 T 06-12-17

## 2017-07-10 ENCOUNTER — Observation Stay (HOSPITAL_COMMUNITY)
Admission: RE | Admit: 2017-07-10 | Discharge: 2017-07-11 | Disposition: A | Discharge status: Home / Self Care | Payer: BLUE CROSS/BLUE SHIELD | Source: Ambulatory Visit | Attending: Orthopedic Surgery | Admitting: Orthopedic Surgery

## 2017-07-10 ENCOUNTER — Inpatient Hospital Stay (HOSPITAL_COMMUNITY): Payer: BLUE CROSS/BLUE SHIELD | Admitting: Anesthesiology

## 2017-07-10 ENCOUNTER — Inpatient Hospital Stay (HOSPITAL_COMMUNITY): Payer: BLUE CROSS/BLUE SHIELD

## 2017-07-10 ENCOUNTER — Inpatient Hospital Stay (HOSPITAL_COMMUNITY): Payer: BLUE CROSS/BLUE SHIELD | Admitting: Emergency Medicine

## 2017-07-10 ENCOUNTER — Encounter (HOSPITAL_COMMUNITY): Payer: Self-pay | Admitting: Anesthesiology

## 2017-07-10 ENCOUNTER — Encounter (HOSPITAL_COMMUNITY)
Admission: RE | Disposition: A | Discharge status: Home / Self Care | Payer: Self-pay | Source: Ambulatory Visit | Attending: Orthopedic Surgery

## 2017-07-10 DIAGNOSIS — M1712 Unilateral primary osteoarthritis, left knee: Secondary | ICD-10-CM | POA: Diagnosis present

## 2017-07-10 DIAGNOSIS — I10 Essential (primary) hypertension: Secondary | ICD-10-CM | POA: Diagnosis not present

## 2017-07-10 DIAGNOSIS — Z79899 Other long term (current) drug therapy: Secondary | ICD-10-CM | POA: Insufficient documentation

## 2017-07-10 DIAGNOSIS — E039 Hypothyroidism, unspecified: Secondary | ICD-10-CM | POA: Diagnosis not present

## 2017-07-10 DIAGNOSIS — Z87891 Personal history of nicotine dependence: Secondary | ICD-10-CM | POA: Insufficient documentation

## 2017-07-10 DIAGNOSIS — Z96659 Presence of unspecified artificial knee joint: Secondary | ICD-10-CM

## 2017-07-10 HISTORY — PX: TOTAL KNEE ARTHROPLASTY: SHX125

## 2017-07-10 SURGERY — ARTHROPLASTY, KNEE, TOTAL
Anesthesia: Spinal | Laterality: Left

## 2017-07-10 MED ORDER — GABAPENTIN 300 MG PO CAPS
300.0000 mg | ORAL_CAPSULE | Freq: Once | ORAL | Status: AC
  Administered 2017-07-10: 300 mg via ORAL

## 2017-07-10 MED ORDER — LACTATED RINGERS IV SOLN: INTRAVENOUS | Status: DC

## 2017-07-10 MED ORDER — CEFAZOLIN SODIUM-DEXTROSE 1-4 GM/50ML-% IV SOLN
1.0000 g | Freq: Four times a day (QID) | INTRAVENOUS | Status: AC
  Administered 2017-07-10 – 2017-07-11 (×2): 1 g via INTRAVENOUS
  Filled 2017-07-10 (×2): qty 50

## 2017-07-10 MED ORDER — DOCUSATE SODIUM 100 MG PO CAPS: 100.0000 mg | ORAL_CAPSULE | Freq: Two times a day (BID) | ORAL | 0 refills | Status: AC

## 2017-07-10 MED ORDER — POLYETHYLENE GLYCOL 3350 17 G PO PACK: 17.0000 g | PACK | Freq: Every day | ORAL | Status: DC | PRN

## 2017-07-10 MED ORDER — KETOROLAC TROMETHAMINE 30 MG/ML IJ SOLN
INTRAMUSCULAR | Status: DC | PRN
  Administered 2017-07-10: 30 mg via INTRA_ARTICULAR

## 2017-07-10 MED ORDER — PREDNISONE 5 MG PO TABS
2.5000 mg | ORAL_TABLET | Freq: Every day | ORAL | Status: DC
  Administered 2017-07-10: 2.5 mg via ORAL

## 2017-07-10 MED ORDER — SODIUM CHLORIDE 0.9 % IR SOLN
Status: DC | PRN
  Administered 2017-07-10: 3000 mL

## 2017-07-10 MED ORDER — ONDANSETRON HCL 4 MG PO TABS
4.0000 mg | ORAL_TABLET | Freq: Four times a day (QID) | ORAL | Status: DC | PRN
  Administered 2017-07-11 (×2): 4 mg via ORAL
  Filled 2017-07-10 (×2): qty 1

## 2017-07-10 MED ORDER — ONDANSETRON HCL 4 MG/2ML IJ SOLN: 4.0000 mg | Freq: Once | INTRAMUSCULAR | Status: DC | PRN

## 2017-07-10 MED ORDER — ONDANSETRON HCL 4 MG/2ML IJ SOLN
INTRAMUSCULAR | Status: DC | PRN
  Administered 2017-07-10: 4 mg via INTRAVENOUS

## 2017-07-10 MED ORDER — METHOCARBAMOL 500 MG PO TABS
ORAL_TABLET | ORAL | Status: AC
  Filled 2017-07-10: qty 1

## 2017-07-10 MED ORDER — BUPIVACAINE HCL (PF) 0.25 % IJ SOLN
INTRAMUSCULAR | Status: DC | PRN
  Administered 2017-07-10: 30 mL

## 2017-07-10 MED ORDER — DOCUSATE SODIUM 100 MG PO CAPS
100.0000 mg | ORAL_CAPSULE | Freq: Two times a day (BID) | ORAL | Status: DC
  Administered 2017-07-10 – 2017-07-11 (×2): 100 mg via ORAL
  Filled 2017-07-10 (×2): qty 1

## 2017-07-10 MED ORDER — ALBUTEROL SULFATE (2.5 MG/3ML) 0.083% IN NEBU: 3.0000 mL | INHALATION_SOLUTION | Freq: Four times a day (QID) | RESPIRATORY_TRACT | Status: DC | PRN

## 2017-07-10 MED ORDER — HYDROCODONE-ACETAMINOPHEN 7.5-325 MG PO TABS: 1.0000 | ORAL_TABLET | ORAL | 0 refills | Status: AC | PRN

## 2017-07-10 MED ORDER — FOLIC ACID 1 MG PO TABS
1.0000 mg | ORAL_TABLET | Freq: Every day | ORAL | Status: DC
  Administered 2017-07-11: 1 mg via ORAL
  Filled 2017-07-10: qty 1

## 2017-07-10 MED ORDER — KETOROLAC TROMETHAMINE 15 MG/ML IJ SOLN
INTRAMUSCULAR | Status: AC
  Filled 2017-07-10: qty 1

## 2017-07-10 MED ORDER — PROPOFOL 500 MG/50ML IV EMUL
INTRAVENOUS | Status: DC | PRN
  Administered 2017-07-10: 25 ug/kg/min via INTRAVENOUS

## 2017-07-10 MED ORDER — HYDROCODONE-ACETAMINOPHEN 7.5-325 MG PO TABS
1.0000 | ORAL_TABLET | ORAL | Status: DC | PRN
  Administered 2017-07-10 – 2017-07-11 (×2): 1 via ORAL
  Filled 2017-07-10 (×2): qty 1

## 2017-07-10 MED ORDER — METOCLOPRAMIDE HCL 5 MG/ML IJ SOLN
5.0000 mg | Freq: Three times a day (TID) | INTRAMUSCULAR | Status: DC | PRN
  Administered 2017-07-11: 10 mg via INTRAVENOUS
  Filled 2017-07-10: qty 2

## 2017-07-10 MED ORDER — HYDROCODONE-ACETAMINOPHEN 7.5-325 MG PO TABS
2.0000 | ORAL_TABLET | ORAL | Status: DC | PRN
  Administered 2017-07-10 – 2017-07-11 (×3): 2 via ORAL
  Filled 2017-07-10 (×3): qty 2

## 2017-07-10 MED ORDER — 0.9 % SODIUM CHLORIDE (POUR BTL) OPTIME
TOPICAL | Status: DC | PRN
  Administered 2017-07-10: 1000 mL

## 2017-07-10 MED ORDER — METHOCARBAMOL 500 MG PO TABS: 500.0000 mg | ORAL_TABLET | Freq: Four times a day (QID) | ORAL | 0 refills | Status: AC | PRN

## 2017-07-10 MED ORDER — ASPIRIN EC 325 MG PO TBEC: 325.0000 mg | DELAYED_RELEASE_TABLET | Freq: Every day | ORAL | 0 refills | Status: AC

## 2017-07-10 MED ORDER — LEVOTHYROXINE SODIUM 88 MCG PO TABS
88.0000 ug | ORAL_TABLET | Freq: Every day | ORAL | Status: DC
  Administered 2017-07-11: 88 ug via ORAL
  Filled 2017-07-10: qty 1

## 2017-07-10 MED ORDER — FENTANYL CITRATE (PF) 100 MCG/2ML IJ SOLN
INTRAMUSCULAR | Status: AC
  Filled 2017-07-10: qty 2

## 2017-07-10 MED ORDER — METOCLOPRAMIDE HCL 5 MG PO TABS: 5.0000 mg | ORAL_TABLET | Freq: Three times a day (TID) | ORAL | Status: DC | PRN

## 2017-07-10 MED ORDER — PROPOFOL 10 MG/ML IV BOLUS
INTRAVENOUS | Status: AC
  Filled 2017-07-10: qty 20

## 2017-07-10 MED ORDER — GABAPENTIN 300 MG PO CAPS
ORAL_CAPSULE | ORAL | Status: AC
  Administered 2017-07-10: 300 mg via ORAL
  Filled 2017-07-10: qty 1

## 2017-07-10 MED ORDER — SENNA 8.6 MG PO TABS
1.0000 | ORAL_TABLET | Freq: Two times a day (BID) | ORAL | Status: DC
  Administered 2017-07-10 – 2017-07-11 (×2): 8.6 mg via ORAL
  Filled 2017-07-10 (×2): qty 1

## 2017-07-10 MED ORDER — SORBITOL 70 % SOLN: 30.0000 mL | Freq: Every day | Status: DC | PRN

## 2017-07-10 MED ORDER — CEFAZOLIN SODIUM-DEXTROSE 2-4 GM/100ML-% IV SOLN
INTRAVENOUS | Status: AC
  Filled 2017-07-10: qty 100

## 2017-07-10 MED ORDER — FENTANYL CITRATE (PF) 250 MCG/5ML IJ SOLN
INTRAMUSCULAR | Status: AC
  Filled 2017-07-10: qty 5

## 2017-07-10 MED ORDER — MENTHOL 3 MG MT LOZG: 1.0000 | LOZENGE | OROMUCOSAL | Status: DC | PRN

## 2017-07-10 MED ORDER — KETOROLAC TROMETHAMINE 30 MG/ML IJ SOLN
INTRAMUSCULAR | Status: AC
  Filled 2017-07-10: qty 1

## 2017-07-10 MED ORDER — ACETAMINOPHEN 500 MG PO TABS
ORAL_TABLET | ORAL | Status: AC
Start: 2017-07-10 — End: 2017-07-10
  Administered 2017-07-10: 1000 mg via ORAL
  Filled 2017-07-10: qty 2

## 2017-07-10 MED ORDER — ONDANSETRON HCL 4 MG PO TABS: 4.0000 mg | ORAL_TABLET | Freq: Three times a day (TID) | ORAL | 0 refills | Status: AC | PRN

## 2017-07-10 MED ORDER — BUPIVACAINE HCL (PF) 0.25 % IJ SOLN
INTRAMUSCULAR | Status: AC
  Filled 2017-07-10: qty 30

## 2017-07-10 MED ORDER — MORPHINE SULFATE (PF) 4 MG/ML IV SOLN: 0.5000 mg | INTRAVENOUS | Status: DC | PRN

## 2017-07-10 MED ORDER — ACETAMINOPHEN 650 MG RE SUPP: 650.0000 mg | RECTAL | Status: DC | PRN

## 2017-07-10 MED ORDER — LIDOCAINE 2% (20 MG/ML) 5 ML SYRINGE
INTRAMUSCULAR | Status: AC
  Filled 2017-07-10: qty 5

## 2017-07-10 MED ORDER — CEFAZOLIN SODIUM-DEXTROSE 2-4 GM/100ML-% IV SOLN
2.0000 g | INTRAVENOUS | Status: AC
  Administered 2017-07-10: 2 g via INTRAVENOUS

## 2017-07-10 MED ORDER — PHENOL 1.4 % MT LIQD: 1.0000 | OROMUCOSAL | Status: DC | PRN

## 2017-07-10 MED ORDER — ASPIRIN EC 325 MG PO TBEC
325.0000 mg | DELAYED_RELEASE_TABLET | Freq: Every day | ORAL | Status: DC
  Administered 2017-07-11: 325 mg via ORAL
  Filled 2017-07-10: qty 1

## 2017-07-10 MED ORDER — LACTATED RINGERS IV SOLN
INTRAVENOUS | Status: DC
  Administered 2017-07-10 (×2): via INTRAVENOUS

## 2017-07-10 MED ORDER — OMEPRAZOLE 20 MG PO CPDR: 20.0000 mg | DELAYED_RELEASE_CAPSULE | Freq: Every day | ORAL | 0 refills | Status: AC

## 2017-07-10 MED ORDER — CHLORHEXIDINE GLUCONATE 4 % EX LIQD: 60.0000 mL | Freq: Once | CUTANEOUS | Status: DC

## 2017-07-10 MED ORDER — ONDANSETRON HCL 4 MG/2ML IJ SOLN: 4.0000 mg | Freq: Four times a day (QID) | INTRAMUSCULAR | Status: DC | PRN

## 2017-07-10 MED ORDER — LIDOCAINE 2% (20 MG/ML) 5 ML SYRINGE
INTRAMUSCULAR | Status: DC | PRN
  Administered 2017-07-10: 50 mg via INTRAVENOUS

## 2017-07-10 MED ORDER — LINACLOTIDE 145 MCG PO CAPS
290.0000 ug | ORAL_CAPSULE | Freq: Every day | ORAL | Status: DC
  Administered 2017-07-11: 290 ug via ORAL
  Filled 2017-07-10: qty 1
  Filled 2017-07-10: qty 2

## 2017-07-10 MED ORDER — MIDAZOLAM HCL 2 MG/2ML IJ SOLN
INTRAMUSCULAR | Status: AC
  Filled 2017-07-10: qty 2

## 2017-07-10 MED ORDER — MORPHINE SULFATE (PF) 2 MG/ML IV SOLN: 0.5000 mg | INTRAVENOUS | Status: DC | PRN

## 2017-07-10 MED ORDER — PREDNISONE 5 MG PO TABS
5.0000 mg | ORAL_TABLET | Freq: Every day | ORAL | Status: DC
  Administered 2017-07-11: 5 mg via ORAL
  Filled 2017-07-10 (×2): qty 1

## 2017-07-10 MED ORDER — FENTANYL CITRATE (PF) 100 MCG/2ML IJ SOLN
100.0000 ug | Freq: Once | INTRAMUSCULAR | Status: AC
  Administered 2017-07-10: 50 ug via INTRAVENOUS

## 2017-07-10 MED ORDER — ONDANSETRON HCL 4 MG/2ML IJ SOLN
INTRAMUSCULAR | Status: AC
  Filled 2017-07-10: qty 2

## 2017-07-10 MED ORDER — KETOROLAC TROMETHAMINE 15 MG/ML IJ SOLN: 15.0000 mg | Freq: Three times a day (TID) | INTRAMUSCULAR | Status: DC | PRN

## 2017-07-10 MED ORDER — DIPHENHYDRAMINE HCL 12.5 MG/5ML PO ELIX: 12.5000 mg | ORAL_SOLUTION | ORAL | Status: DC | PRN

## 2017-07-10 MED ORDER — ACETAMINOPHEN 500 MG PO TABS
1000.0000 mg | ORAL_TABLET | Freq: Once | ORAL | Status: AC
  Administered 2017-07-10: 1000 mg via ORAL

## 2017-07-10 MED ORDER — PREDNISONE 2.5 MG PO TABS: 2.5000 mg | ORAL_TABLET | Freq: Two times a day (BID) | ORAL | Status: DC

## 2017-07-10 MED ORDER — LORATADINE 10 MG PO TABS: 10.0000 mg | ORAL_TABLET | Freq: Every day | ORAL | Status: DC | PRN

## 2017-07-10 MED ORDER — SODIUM CHLORIDE FLUSH 0.9 % IV SOLN
INTRAVENOUS | Status: DC | PRN
  Administered 2017-07-10: 30 mL

## 2017-07-10 MED ORDER — FENTANYL CITRATE (PF) 100 MCG/2ML IJ SOLN
25.0000 ug | INTRAMUSCULAR | Status: DC | PRN
  Administered 2017-07-10: 25 ug via INTRAVENOUS

## 2017-07-10 MED ORDER — MIDAZOLAM HCL 2 MG/2ML IJ SOLN
2.0000 mg | Freq: Once | INTRAMUSCULAR | Status: AC
  Administered 2017-07-10 (×2): 1 mg via INTRAVENOUS

## 2017-07-10 MED ORDER — ACETAMINOPHEN 325 MG PO TABS
650.0000 mg | ORAL_TABLET | ORAL | Status: DC | PRN
  Filled 2017-07-10: qty 2

## 2017-07-10 MED ORDER — METHOCARBAMOL 1000 MG/10ML IJ SOLN
500.0000 mg | Freq: Four times a day (QID) | INTRAVENOUS | Status: DC | PRN
  Filled 2017-07-10: qty 5

## 2017-07-10 MED ORDER — METHOCARBAMOL 500 MG PO TABS
500.0000 mg | ORAL_TABLET | Freq: Four times a day (QID) | ORAL | Status: DC | PRN
  Administered 2017-07-10 – 2017-07-11 (×3): 500 mg via ORAL
  Filled 2017-07-10 (×3): qty 1

## 2017-07-10 SURGICAL SUPPLY — 53 items
BANDAGE ESMARK 6X9 LF (GAUZE/BANDAGES/DRESSINGS) ×1 IMPLANT
BLADE SAG 18X100X1.27 (BLADE) ×2 IMPLANT
BNDG COHESIVE 6X5 TAN STRL LF (GAUZE/BANDAGES/DRESSINGS) ×2 IMPLANT
BNDG ESMARK 6X9 LF (GAUZE/BANDAGES/DRESSINGS) ×2
BOWL SMART MIX CTS (DISPOSABLE) ×2 IMPLANT
CAPT KNEE TOTAL 3 ×2 IMPLANT
CEMENT BONE SIMPLEX SPEEDSET (Cement) ×2 IMPLANT
CLSR STERI-STRIP ANTIMIC 1/2X4 (GAUZE/BANDAGES/DRESSINGS) ×2 IMPLANT
COVER SURGICAL LIGHT HANDLE (MISCELLANEOUS) ×2 IMPLANT
CUFF TOURNIQUET SINGLE 34IN LL (TOURNIQUET CUFF) ×2 IMPLANT
DRAPE EXTREMITY T 121X128X90 (DRAPE) ×2 IMPLANT
DRAPE HALF SHEET 40X57 (DRAPES) ×2 IMPLANT
DRAPE U-SHAPE 47X51 STRL (DRAPES) ×2 IMPLANT
DRSG MEPILEX BORDER 4X12 (GAUZE/BANDAGES/DRESSINGS) ×2 IMPLANT
DRSG MEPILEX BORDER 4X8 (GAUZE/BANDAGES/DRESSINGS) IMPLANT
DURAPREP 26ML APPLICATOR (WOUND CARE) ×4 IMPLANT
ELECT CAUTERY BLADE 6.4 (BLADE) ×2 IMPLANT
ELECT REM PT RETURN 9FT ADLT (ELECTROSURGICAL) ×2
ELECTRODE REM PT RTRN 9FT ADLT (ELECTROSURGICAL) ×1 IMPLANT
FACESHIELD WRAPAROUND (MASK) ×4 IMPLANT
GLOVE BIO SURGEON STRL SZ7.5 (GLOVE) ×4 IMPLANT
GLOVE BIOGEL PI IND STRL 8 (GLOVE) ×2 IMPLANT
GLOVE BIOGEL PI INDICATOR 8 (GLOVE) ×2
GLOVE INDICATOR 7.0 STRL GRN (GLOVE) ×2 IMPLANT
GLOVE SURG SS PI 7.0 STRL IVOR (GLOVE) ×2 IMPLANT
GOWN STRL REUS W/ TWL LRG LVL3 (GOWN DISPOSABLE) ×2 IMPLANT
GOWN STRL REUS W/ TWL XL LVL3 (GOWN DISPOSABLE) ×1 IMPLANT
GOWN STRL REUS W/TWL LRG LVL3 (GOWN DISPOSABLE) ×2
GOWN STRL REUS W/TWL XL LVL3 (GOWN DISPOSABLE) ×1
HANDPIECE INTERPULSE COAX TIP (DISPOSABLE)
IMMOBILIZER KNEE 22 UNIV (SOFTGOODS) ×2 IMPLANT
KIT BASIN OR (CUSTOM PROCEDURE TRAY) ×2 IMPLANT
KIT ROOM TURNOVER OR (KITS) ×2 IMPLANT
MANIFOLD NEPTUNE II (INSTRUMENTS) ×2 IMPLANT
NEEDLE 22X1 1/2 (OR ONLY) (NEEDLE) ×2 IMPLANT
NS IRRIG 1000ML POUR BTL (IV SOLUTION) ×2 IMPLANT
PACK TOTAL JOINT (CUSTOM PROCEDURE TRAY) ×2 IMPLANT
PAD ARMBOARD 7.5X6 YLW CONV (MISCELLANEOUS) ×2 IMPLANT
SET HNDPC FAN SPRY TIP SCT (DISPOSABLE) IMPLANT
SUCTION FRAZIER HANDLE 10FR (MISCELLANEOUS) ×1
SUCTION TUBE FRAZIER 10FR DISP (MISCELLANEOUS) ×1 IMPLANT
SUT MNCRL AB 4-0 PS2 18 (SUTURE) ×2 IMPLANT
SUT MON AB 2-0 CT1 27 (SUTURE) ×2 IMPLANT
SUT MON AB 2-0 CT1 36 (SUTURE) ×2 IMPLANT
SUT VIC AB 0 CT1 27 (SUTURE) ×1
SUT VIC AB 0 CT1 27XBRD ANBCTR (SUTURE) ×1 IMPLANT
SUT VIC AB 1 CT1 27 (SUTURE) ×2
SUT VIC AB 1 CT1 27XBRD ANBCTR (SUTURE) ×2 IMPLANT
SYR 50ML LL SCALE MARK (SYRINGE) ×2 IMPLANT
TOWEL OR 17X24 6PK STRL BLUE (TOWEL DISPOSABLE) ×2 IMPLANT
TOWEL OR 17X26 10 PK STRL BLUE (TOWEL DISPOSABLE) ×2 IMPLANT
TRAY CATH 16FR W/PLASTIC CATH (SET/KITS/TRAYS/PACK) ×2 IMPLANT
TRAY FOLEY BAG SILVER LF 14FR (CATHETERS) IMPLANT

## 2017-07-10 NOTE — Progress Notes (Signed)
Orthopedic Tech Progress Note Patient Details:  Natalie Franklin 1951-08-26 [id number]  CPM Left Knee CPM Left Knee: On Left Knee Flexion (Degrees): 90 Left Knee Extension (Degrees): 0 Additional Comments: Trapeze bar   Saul Fordyce 07/10/2017, 1:21 PM

## 2017-07-10 NOTE — Op Note (Signed)
DATE OF SURGERY:  07/10/2017 TIME: 11:31 AM  PATIENT NAME:  Natalie Franklin   AGE: 66 y.o.    PRE-OPERATIVE DIAGNOSIS:  OA LEFT KNEE  POST-OPERATIVE DIAGNOSIS:  Same  PROCEDURE:  Procedure(s): TOTAL KNEE ARTHROPLASTY   SURGEON:  Jerin Franzel D, MD   ASSISTANT:  Aquilla Hacker, PA-C, he was present and scrubbed throughout the case, critical for completion in a timely fashion, and for retraction, instrumentation, and closure. Richardson Landry MD   OPERATIVE IMPLANTS: [employer] Triathlon Posterior Stabilized.  Femur size 3, Tibia size 3, Patella size 29 3-peg oval button, with a 9 mm polyethylene insert.   PREOPERATIVE INDICATIONS:  Natalie Franklin is a 66 y.o. year old female with end stage bone on bone degenerative arthritis of the knee who failed conservative treatment, including injections, antiinflammatories, activity modification, and assistive devices, and had significant impairment of their activities of daily living, and elected for Total Knee Arthroplasty.   The risks, benefits, and alternatives were discussed at length including but not limited to the risks of infection, bleeding, nerve injury, stiffness, blood clots, the need for revision surgery, cardiopulmonary complications, among others, and they were willing to proceed.   OPERATIVE DESCRIPTION:  The patient was brought to the operative room and placed in a supine position.  General anesthesia was administered.  IV antibiotics were given.  The lower extremity was prepped and draped in the usual sterile fashion.  Time out was performed.  The leg was elevated and exsanguinated and the tourniquet was inflated.  Anterior approach was performed.  The patella was everted and osteophytes were removed.  The anterior horn of the medial and lateral meniscus was removed.   The distal femur was opened with the drill and the intramedullary distal femoral cutting jig was utilized, set at 5 degrees resecting 10 mm off the distal  femur.  Care was taken to protect the collateral ligaments.  The distal femoral sizing jig was applied, taking care to avoid notching.  Then the 4-in-1 cutting jig was applied and the anterior and posterior femur was cut, along with the chamfer cuts.  All posterior osteophytes were removed.  The flexion gap was then measured and was symmetric with the extension gap.  Then the extramedullary tibial cutting jig was utilized making the appropriate cut using the anterior tibial crest as a reference building in appropriate posterior slope.  Care was taken during the cut to protect the medial and collateral ligaments.  The proximal tibia was removed along with the posterior horns of the menisci.  The PCL was sacrificed.    The extensor gap was measured and was approximately 78mm.    I completed the distal femoral preparation using the appropriate jig to prepare the box.  The patella was then measured, and cut with the saw.    The proximal tibia sized and prepared accordingly with the reamer and the punch, and then all components were trialed with the 65mm poly insert.  The knee was found to have excellent balance and full motion.    The above named components were then cemented into place and all excess cement was removed. Poly tibial piece and patella were inserted.  I was very happy with his stability and ROM  I performed a periarticular injection with marcaine and toradol  The knee was easily taken through a range of motion and the patella tracked well and the knee irrigated copiously and the parapatellar and subcutaneous tissue closed with vicryl, and monocryl with steri strips for  the skin.  The incision was dressed with sterile gauze and the tourniquet released and the patient was awakened and returned to the PACU in stable and satisfactory condition.  There were no complications.  Total tourniquet time was 75 minutes.   POSTOPERATIVE PLAN: post op Abx, DVT px: SCD's, TED's, Early ambulation  and chemical px

## 2017-07-10 NOTE — Evaluation (Signed)
Physical Therapy Evaluation Patient Details Name: Natalie Franklin MRN: [id number] DOB: 1951/05/28 Today's Date: 07/10/2017   History of Present Illness  Pt presents for L TKA with PMH of RA and hypothyroidism and R TKA in 8/18.  Clinical Impression  Pt admitted with above diagnosis. Pt currently with functional limitations due to the deficits listed below (see PT Problem List). Pt transferred bed to [facility] and BSC to chair with mod A, was limited with mobility due to numbness in hips and buttocks region. RN aware.  Pt will benefit from skilled PT to increase their independence and safety with mobility to allow discharge to the venue listed below.       Follow Up Recommendations DC plan and follow up therapy as arranged by surgeon    Equipment Recommendations  None recommended by PT    Recommendations for Other Services       Precautions / Restrictions Precautions Precautions: Knee Precaution Booklet Issued: Yes (comment) Precaution Comments: ther ex handout Restrictions Weight Bearing Restrictions: Yes LLE Weight Bearing: Weight bearing as tolerated      Mobility  Bed Mobility Overal bed mobility: Needs Assistance Bed Mobility: Supine to Sit     Supine to sit: Supervision     General bed mobility comments: pt able to get to EOB using UE's and could manage LLE independently  Transfers Overall transfer level: Needs assistance Equipment used: None Transfers: Sit to/from [employer] Sit to Stand: Mod assist Stand pivot transfers: Mod assist       General transfer comment: pt limited by decreased sensation B hips, pivoted to [facility] and then chair with therpaist directly in front of pt for support and transfer to R each time  Ambulation/Gait             General Gait Details: did not attempt due to numbness  Stairs            Wheelchair Mobility    Modified Rankin (Stroke Patients Only)       Balance Overall balance assessment: Needs  assistance Sitting-balance support: No upper extremity supported Sitting balance-Leahy Scale: Fair Sitting balance - Comments: insecure EOB due to numbness in buttocks area   Standing balance support: Bilateral upper extremity supported Standing balance-Leahy Scale: Poor Standing balance comment: needed B UE support to stand                             Pertinent Vitals/Pain Pain Assessment: Faces Faces Pain Scale: Hurts little more Pain Location: L knee Pain Descriptors / Indicators: Aching Pain Intervention(s): Limited activity within patient's tolerance;Monitored during session    Home Living Family/patient expects to be discharged to:: Private residence Living Arrangements: Spouse/significant other Available Help at Discharge: Family;Available 24 hours/day Type of Home: House Home Access: Stairs to enter Entrance Stairs-Rails: Right Entrance Stairs-Number of Steps: 5 Home Layout: One level Home Equipment: Walker - 2 wheels;Bedside commode      Prior Function Level of Independence: Independent         Comments: pt has done well since R TKA in [place]     Hand Dominance   Dominant Hand: Right    Extremity/Trunk Assessment   Upper Extremity Assessment Upper Extremity Assessment: Defer to OT evaluation    Lower Extremity Assessment Lower Extremity Assessment: LLE deficits/detail LLE Deficits / Details: hip flex 3/5, knee ext 3/5, knee flex 3/5. Of note, pt does not have sensation around bilateral hips and buttocks on eval.  Had urinated in bed and did not realize and urinated on BSC and could not feel it LLE Sensation: decreased light touch;decreased proprioception    Cervical / Trunk Assessment Cervical / Trunk Assessment: Normal  Communication   Communication: No difficulties  Cognition Arousal/Alertness: Awake/alert Behavior During Therapy: WFL for tasks assessed/performed Overall Cognitive Status: Within Functional Limits for tasks assessed                                         General Comments General comments (skin integrity, edema, etc.): pt has chosen to not have CPM at home this time because it hurt her back with last surgery    Exercises Total Joint Exercises Ankle Circles/Pumps: AROM;Both;10 reps;Seated Quad Sets: AROM;Both;10 reps;Seated Heel Slides: AROM;Left;10 reps;Supine Goniometric ROM: 5-80   Assessment/Plan    PT Assessment Patient needs continued PT services  PT Problem List Decreased strength;Decreased range of motion;Decreased activity tolerance;Decreased balance;Decreased mobility;Impaired sensation;Pain       PT Treatment Interventions DME instruction;Gait training;Stair training;Functional mobility training;Therapeutic activities;Therapeutic exercise;Balance training;Neuromuscular re-education;Patient/family education    PT Goals (Current goals can be found in the Care Plan section)  Acute Rehab PT Goals Patient Stated Goal: return home PT Goal Formulation: With patient Time For Goal Achievement: 07/17/17 Potential to Achieve Goals: Good    Frequency 7X/week   Barriers to discharge        Co-evaluation               AM-PAC PT "6 Clicks" Daily Activity  Outcome Measure Difficulty turning over in bed (including adjusting bedclothes, sheets and blankets)?: None Difficulty moving from lying on back to sitting on the side of the bed? : None Difficulty sitting down on and standing up from a chair with arms (e.g., wheelchair, bedside commode, etc,.)?: Unable Help needed moving to and from a bed to chair (including a wheelchair)?: A Lot Help needed walking in hospital room?: Total Help needed climbing 3-5 steps with a railing? : Total 6 Click Score: 13    End of Session Equipment Utilized During Treatment: Gait belt Activity Tolerance: Other (comment) (limited by lack of sensation) Patient left: in chair;with call bell/phone within reach;with family/visitor  present Nurse Communication: Mobility status;Other (comment) (sensation deficit) PT Visit Diagnosis: Other symptoms and signs involving the nervous system (R29.898);Pain;Difficulty in walking, not elsewhere classified (R26.2) Pain - Right/Left: Left Pain - part of body: Knee    Time: 0277-4128 PT Time Calculation (min) (ACUTE ONLY): 30 min   Charges:   PT Evaluation $PT Eval Moderate Complexity: 1 Mod PT Treatments $Therapeutic Activity: 8-22 mins   PT G Codes:        Lyanne Co, PT  Acute Rehab Services  [phone]   [place] L Zyrus Hetland 07/10/2017, 5:14 PM

## 2017-07-10 NOTE — Transfer of Care (Signed)
Immediate Anesthesia Transfer of Care Note  Patient: Natalie Franklin  Procedure(s) Performed: TOTAL KNEE ARTHROPLASTY (Left )  Patient Location: PACU  Anesthesia Type:MAC and Spinal  Level of Consciousness: awake and alert   Airway & Oxygen Therapy: Patient Spontanous Breathing and Patient connected to face mask oxygen  Post-op Assessment: Report given to RN and Post -op Vital signs reviewed and stable  Post vital signs: Reviewed and stable  Last Vitals:  Vitals:   07/10/17 0902  BP: (!) 167/77  Pulse: 79  Resp: 18  Temp: 36.9 C  SpO2: 99%    Last Pain:  Vitals:   07/10/17 0902  TempSrc: Oral  PainSc: 3          Complications: No apparent anesthesia complications

## 2017-07-10 NOTE — Anesthesia Procedure Notes (Signed)
Procedure Name: MAC Date/Time: 07/10/2017 10:29 AM Performed by: Lieutenant Diego Pre-anesthesia Checklist: Patient identified, Emergency Drugs available, Suction available, Patient being monitored and Timeout performed Patient Re-evaluated:Patient Re-evaluated prior to induction Oxygen Delivery Method: Simple face mask Preoxygenation: Pre-oxygenation with 100% oxygen Induction Type: IV induction

## 2017-07-10 NOTE — Anesthesia Procedure Notes (Signed)
Spinal  Patient location during procedure: OR Start time: 07/10/2017 11:45 AM End time: 07/10/2017 11:50 AM Staffing Anesthesiologist: Noreene Larsson, Aryssa Rosamond Performed: anesthesiologist  Preanesthetic Checklist Completed: patient identified, site marked, surgical consent, pre-op evaluation, timeout performed, IV checked, risks and benefits discussed and monitors and equipment checked Spinal Block Patient position: sitting Prep: ChloraPrep Patient monitoring: heart rate, cardiac monitor, continuous pulse ox and blood pressure Approach: midline Location: L3-4 Injection technique: single-shot Needle Needle type: Tuohy  Needle gauge: 22 G Assessment Sensory level: T6 Additional Notes 10 mg 0.75% Bupivacaine injected easily

## 2017-07-10 NOTE — Progress Notes (Signed)
Pt states her bottom is numb and she cannot feel it when she voids. RN notified MD. No new orders.

## 2017-07-10 NOTE — Progress Notes (Signed)
Anesthesiology Follow-up:  Called to evaluate patient due to complaints of residual numbness in buttocks and perineum. Unable to sense when she voids. Patient had spinal anesthetic for Left total knee arthroplasty.  Block placed at 10:29 AM toady with 12 mg 0.75% bupivacaine. Now able to stand and walk with assistance. No back pain, LE weakness from spinal has progressively resolved.  Exam; Awake and alert, standing with hands on walker  VS: T- 36.6 BP- 144/69 RR-16 HR 64 O2 Sat 98% on RA  Strength and sensation grossly intact in LEs  Impression: perineal and buttock numbness 8 hours following spinal anesthetic with 0.75% bupivacaine. Patient reassured that this most likely represents residual effect from the spinal and should resolve over the next 6-12 hours.  Plan:   1. Walk with assistance 2. Follow-up in AM  [facility]

## 2017-07-10 NOTE — Discharge Instructions (Signed)

## 2017-07-10 NOTE — Anesthesia Preprocedure Evaluation (Addendum)
Anesthesia Evaluation  Patient identified by MRN, date of birth, ID band Patient awake    Reviewed: Allergy & Precautions, NPO status , Patient's Chart, lab work & pertinent test results  Airway Mallampati: II  TM Distance: >3 FB Neck ROM: Full    Dental  (+) Teeth Intact, Dental Advisory Given   Pulmonary former smoker,    breath sounds clear to auscultation       Cardiovascular hypertension,  Rhythm:Regular Rate:Normal     Neuro/Psych    GI/Hepatic   Endo/Other    Renal/GU      Musculoskeletal   Abdominal   Peds  Hematology   Anesthesia Other Findings   Reproductive/Obstetrics                             Anesthesia Physical Anesthesia Plan  ASA: III  Anesthesia Plan: Spinal   Post-op Pain Management:  Regional for Post-op pain   Induction: Intravenous  PONV Risk Score and Plan: Ondansetron and Dexamethasone  Airway Management Planned: Natural Airway and Simple Face Mask  Additional Equipment:   Intra-op Plan:   Post-operative Plan:   Informed Consent: I have reviewed the patients History and Physical, chart, labs and discussed the procedure including the risks, benefits and alternatives for the proposed anesthesia with the patient or authorized representative who has indicated his/her understanding and acceptance.   Dental advisory given  Plan Discussed with: CRNA and Anesthesiologist  Anesthesia Plan Comments:         Anesthesia Quick Evaluation

## 2017-07-10 NOTE — Interval H&P Note (Signed)
History and Physical Interval Note:  07/10/2017 9:08 AM  Natalie Franklin  has presented today for surgery, with the diagnosis of OA LEFT KNEE  The various methods of treatment have been discussed with the patient and family. After consideration of risks, benefits and other options for treatment, the patient has consented to  Procedure(s): TOTAL KNEE ARTHROPLASTY (Left) as a surgical intervention .  The patient's history has been reviewed, patient examined, no change in status, stable for surgery.  I have reviewed the patient's chart and labs.  Questions were answered to the patient's satisfaction.     Trystian Crisanto D

## 2017-07-10 NOTE — Progress Notes (Signed)
I was called at approximately 5:30 re: buttock numbness and inability to feel when voiding, I spoke with Dr. Maple Hudson and Dr. Eulah Pont and coordinated her evaluation, and I appreciate Dr. Morley Kos prompt evaluation.  Based on anesthesia's input, will continue to monitor.   Eulas Post, MD

## 2017-07-10 NOTE — Progress Notes (Signed)
MD assessed pt and states he will give the spinal time to completely wear off. MD states he will stop by to see pt again in the morning. Pt states she would like to stand up and walk. RN educated pt about safety and calling for assistance when she is ready to stand and walk. Pt verbalizes understanding.

## 2017-07-11 ENCOUNTER — Encounter (HOSPITAL_COMMUNITY): Payer: Self-pay | Admitting: Orthopedic Surgery

## 2017-07-11 DIAGNOSIS — M1712 Unilateral primary osteoarthritis, left knee: Secondary | ICD-10-CM | POA: Diagnosis not present

## 2017-07-11 NOTE — Anesthesia Postprocedure Evaluation (Signed)
Anesthesia Post Note  Patient: Natalie Franklin  Procedure(s) Performed: TOTAL KNEE ARTHROPLASTY (Left )     Patient location during evaluation: Nursing Unit Anesthesia Type: Spinal Level of consciousness: awake, awake and alert and oriented Pain management: pain level controlled Vital Signs Assessment: post-procedure vital signs reviewed and stable Respiratory status: nonlabored ventilation, spontaneous breathing and respiratory function stable Cardiovascular status: blood pressure returned to baseline Anesthetic complications: no    Last Vitals:  Vitals:   07/11/17 0608 07/11/17 1321  BP: (!) 129/59 (!) 141/68  Pulse: 60 67  Resp: 16 18  Temp: 36.7 C (!) 36.2 C  SpO2: 98% 98%    Last Pain:  Vitals:   07/11/17 1321  TempSrc: Oral  PainSc:                  Jourdan Maldonado COKER

## 2017-07-11 NOTE — Progress Notes (Signed)
OT Cancellation Note  Patient Details Name: OKIE JANSSON MRN: [id number] DOB: October 05, 1950   Cancelled Treatment:    Reason Eval/Treat Not Completed: Medical issues which prohibited therapy (Pt with nausea. Receiving medication. Will try back)  Evern Bio 07/11/2017, 11:21 AM  07/11/2017 Martie Round, OTR/L Pager: [phone]

## 2017-07-11 NOTE — Discharge Summary (Signed)
Discharge Summary  Patient ID: Natalie Franklin MRN: [id number] DOB/AGE: 64-Feb-1952 64 y.o.  Admit date: 07/10/2017 Discharge date: 07/11/2017  Admission Diagnoses:  <principal problem not specified>  Discharge Diagnoses:  Active Problems:   Primary osteoarthritis of left knee   Past Medical History:  Diagnosis Date  . Chronic constipation   . Complication of anesthesia    Hard to Awaken post anesthesia last surgery ok  . COPD (chronic obstructive pulmonary disease) (HCC)    ? "pulmonary dr"  . GERD (gastroesophageal reflux disease)    occ  . History of frequent urinary tract infections   . History of hiatal hernia   . Hypertension   . Hypothyroidism   . Migraine    "none in a couple years" (05/02/2017)  . Osteoarthritis   . Pneumonia    "had it in my younger years; haven't had it in a long time" (05/02/2017)  . Rheumatoid arthritis (HCC)    "qwhere" (05/02/2017)    Surgeries: Procedure(s): TOTAL KNEE ARTHROPLASTY on 07/10/2017   Consultants (if any):   Discharged Condition: Improved  Hospital Course: Natalie Franklin is an 66 y.o. female who was admitted 07/10/2017 with a diagnosis of <principal problem not specified> and went to the operating room on 07/10/2017 and underwent the above named procedures.    She was given perioperative antibiotics:  Anti-infectives    Start     Dose/Rate Route Frequency Ordered Stop   07/10/17 1800  ceFAZolin (ANCEF) IVPB 1 g/50 mL premix     1 g 100 mL/hr over 30 Minutes Intravenous Every 6 hours 07/10/17 1604 07/11/17 0036   07/10/17 1000  ceFAZolin (ANCEF) IVPB 2g/100 mL premix     2 g 200 mL/hr over 30 Minutes Intravenous On call to O.R. 07/10/17 0849 07/10/17 1045   07/10/17 0855  ceFAZolin (ANCEF) 2-4 GM/100ML-% IVPB    Comments:  Marrianne Franklin   : cabinet override      07/10/17 0855 07/10/17 2059    .  She was given sequential compression devices, early ambulation, and Aspirin for DVT prophylaxis.  She benefited  maximally from the hospital stay and there were no complications.    Recent vital signs:  Vitals:   07/11/17 0020 07/11/17 0608  BP: 114/69 (!) 129/59  Pulse: 64 60  Resp: 13 16  Temp: 98 F (36.7 C) 98.1 F (36.7 C)  SpO2: 99% 98%    Recent laboratory studies:  Lab Results  Component Value Date   HGB 13.4 06/29/2017   HGB 10.2 (L) 05/04/2017   HGB 9.7 (L) 05/03/2017   Lab Results  Component Value Date   WBC 14.6 (H) 06/29/2017   PLT 435 (H) 06/29/2017   No results found for: INR Lab Results  Component Value Date   NA 136 06/29/2017   K 3.8 06/29/2017   CL 105 06/29/2017   CO2 23 06/29/2017   BUN 6 06/29/2017   CREATININE 0.54 06/29/2017   GLUCOSE 86 06/29/2017    Discharge Medications:   Allergies as of 07/11/2017      Reactions   Macrodantin [nitrofurantoin Macrocrystal] Other (See Comments)   Hospitalized for possible interstitial pulmonary disease "Chemical Pneumonia"   Septra [sulfamethoxazole-trimethoprim] Other (See Comments)   PT HAD BUSTED TENDONS AND TOLD NOT TO TAKE    Dilaudid [hydromorphone Hcl] Nausea And Vomiting, Other (See Comments)   "Out for a while"   Albuterol Other (See Comments)   headache   Celecoxib Nausea Only   Leflunomide Nausea Only  Medication List    TAKE these medications   aspirin EC 325 MG tablet Take 1 tablet (325 mg total) by mouth daily. For 30 days post op for DVT Prophylaxis What changed:  additional instructions   B COMPLEX-VITAMIN B12 PO Take 1 tablet by mouth daily.   Biotin 5000 MCG Subl Place 10,000 mcg under the tongue daily.   cholecalciferol 1000 units tablet Commonly known as:  VITAMIN D Take 1,000 Units by mouth daily.   docusate sodium 100 MG capsule Commonly known as:  COLACE Take 1 capsule (100 mg total) by mouth 2 (two) times daily. To prevent constipation while taking pain medication.   folic acid 1 MG tablet Commonly known as:  FOLVITE Take 1 mg by mouth daily before breakfast.    HYDROcodone-acetaminophen 7.5-325 MG tablet Commonly known as:  NORCO Take 1-2 tablets by mouth every 4 (four) hours as needed for moderate pain. What changed:  how much to take  how to take this  when to take this  reasons to take this  additional instructions   levothyroxine 88 MCG tablet Commonly known as:  SYNTHROID, LEVOTHROID Take 88 mcg by mouth daily before breakfast.   LINZESS 290 MCG Caps capsule Generic drug:  linaclotide Take 290 mcg by mouth daily.   loratadine 10 MG tablet Commonly known as:  CLARITIN Take 10 mg by mouth daily as needed for allergies.   methocarbamol 500 MG tablet Commonly known as:  ROBAXIN Take 1 tablet (500 mg total) by mouth every 6 (six) hours as needed for muscle spasms.   methotrexate 2.5 MG tablet Commonly known as:  RHEUMATREX Take 12.5 mg by mouth once a week. Caution:Chemotherapy. Protect from light.  On Thursdays   omeprazole 20 MG capsule Commonly known as:  PRILOSEC Take 1 capsule (20 mg total) by mouth daily. 30 days for gastroprotection while taking Aspirin.   ondansetron 4 MG tablet Commonly known as:  ZOFRAN Take 1 tablet (4 mg total) by mouth every 8 (eight) hours as needed for nausea or vomiting.   predniSONE 5 MG tablet Commonly known as:  DELTASONE Take 2.5-5 mg by mouth 2 (two) times daily. Takes 1 tablet in the morning and 0.5 tablet in the evening   PROAIR HFA 108 (90 Base) MCG/ACT inhaler Generic drug:  albuterol Inhale 2 puffs into the lungs every 6 (six) hours as needed for wheezing or shortness of breath.   SIMPONI ARIA IV Inject into the vein every 8 (eight) weeks. Done at Dr office       Diagnostic Studies: Dg Knee Left Port  Result Date: 07/10/2017 CLINICAL DATA:  Status post total knee replacement EXAM: PORTABLE LEFT KNEE - 1-2 VIEW COMPARISON:  None. FINDINGS: Frontal and lateral views were obtained. There is a total knee replacement with femoral and prosthetic components well-seated. No  acute fracture dislocation. Soft tissue air is an expected postoperative finding. IMPRESSION: Total knee replacement with prosthetic components well-seated. No acute fracture or dislocation. Electronically Signed   By: Bretta Bang III M.D.   On: 07/10/2017 12:55    Disposition: 06-Home-Health Care Svc    Follow-up Information    Sheral Apley, MD Follow up.   Specialty:  Orthopedic Surgery Contact information: [address] [place] [address] [phone]            Signed: Albina Billet III PA-C 07/11/2017, 7:46 AM

## 2017-07-11 NOTE — Progress Notes (Signed)
Physical Therapy Treatment Patient Details Name: Natalie Franklin MRN: [id number] DOB: October 18, 1950 Today's Date: 07/11/2017    History of Present Illness Pt presents for L TKA with PMH of RA and hypothyroidism and R TKA in 8/18.    PT Comments    Pt was up to side of bed and tried to stand but very nauseated.  Had some meds to ease her nausea but were not effective when PT tried to walk her.  Pt is very interested in decreasing symptoms and will  Try later as pt can allow to continue walking her and increasing her independence.  Pt is very able with supervision to get to side of bed and back with no incident.l    Follow Up Recommendations  DC plan and follow up therapy as arranged by surgeon     Equipment Recommendations  None recommended by PT    Recommendations for Other Services       Precautions / Restrictions Precautions Precautions: Knee Restrictions Weight Bearing Restrictions: Yes LLE Weight Bearing: Weight bearing as tolerated    Mobility  Bed Mobility Overal bed mobility: Needs Assistance Bed Mobility: Supine to Sit;Sit to Supine     Supine to sit: Supervision Sit to supine: Supervision   General bed mobility comments: Pt can get to side of bed with rail and assistance of HOB but is surprisingly good with LLE considering pain  Transfers Overall transfer level: Needs assistance Equipment used: Rolling walker (2 wheeled) Transfers: Sit to/from [employer] Sit to Stand: Min assist         General transfer comment: deferred due to pain on L Knee and subsequent nausea  Ambulation/Gait             General Gait Details: not done due to nausea   Stairs            Wheelchair Mobility    Modified Rankin (Stroke Patients Only)       Balance Overall balance assessment: Needs assistance Sitting-balance support: No upper extremity supported Sitting balance-Leahy Scale: Good       Standing balance-Leahy Scale:  Poor Standing balance comment: declined due to nausea                            Cognition Arousal/Alertness: Lethargic;Suspect due to medications Behavior During Therapy: [facility] for tasks assessed/performed Overall Cognitive Status: Within Functional Limits for tasks assessed                                        Exercises Total Joint Exercises Ankle Circles/Pumps: AROM;Both;5 reps Quad Sets: AROM;Both;10 reps;Seated Gluteal Sets: AROM;Both;10 reps Heel Slides: AROM;AAROM;Both;10 reps Hip ABduction/ADduction: AROM;AAROM;Both;10 reps    General Comments General comments (skin integrity, edema, etc.): pt has been unable to be up standing for PT today but in interim got up for OT      Pertinent Vitals/Pain Pain Assessment: Faces Faces Pain Scale: Hurts whole lot Pain Location: L knee Pain Descriptors / Indicators: Sore;Operative site guarding;Spasm Pain Intervention(s): Limited activity within patient's tolerance;Monitored during session;Premedicated before session;Repositioned;RN gave pain meds during session    Home Living Family/patient expects to be discharged to:: Private residence Living Arrangements: Spouse/significant other Available Help at Discharge: Family;Available 24 hours/day Type of Home: House Home Access: Stairs to enter Entrance Stairs-Rails: Right Home Layout: One level Home Equipment: Walker - 2 wheels;Bedside  commode;Shower seat;Hand held shower head      Prior Function Level of Independence: Independent          PT Goals (current goals can now be found in the care plan section) Acute Rehab PT Goals Patient Stated Goal: return home Progress towards PT goals: Progressing toward goals    Frequency    7X/week      PT Plan Current plan remains appropriate    Co-evaluation              AM-PAC PT "6 Clicks" Daily Activity  Outcome Measure  Difficulty turning over in bed (including adjusting bedclothes, sheets  and blankets)?: None Difficulty moving from lying on back to sitting on the side of the bed? : None Difficulty sitting down on and standing up from a chair with arms (e.g., wheelchair, bedside commode, etc,.)?: A Little Help needed moving to and from a bed to chair (including a wheelchair)?: A Little Help needed walking in hospital room?: A Little Help needed climbing 3-5 steps with a railing? : A Little 6 Click Score: 20    End of Session Equipment Utilized During Treatment: Gait belt Activity Tolerance: Patient limited by pain;Other (comment) (limited by nausea) Patient left: with call bell/phone within reach;in bed;with family/visitor present;with nursing/sitter in room Nurse Communication: Mobility status PT Visit Diagnosis: Other symptoms and signs involving the nervous system (R29.898);Pain;Difficulty in walking, not elsewhere classified (R26.2) Pain - Right/Left: Left Pain - part of body: Knee     Time: 2458-0998 PT Time Calculation (min) (ACUTE ONLY): 35 min  Charges:  $Therapeutic Exercise: 8-22 mins $Therapeutic Activity: 8-22 mins                    G Codes:  Functional Assessment Tool Used: AM-PAC 6 Clicks Basic Mobility;Clinical judgement Functional Limitation: Mobility: Walking and moving around Mobility: Walking and Moving Around Current Status (P3825): At least 40 percent but less than 60 percent impaired, limited or restricted Mobility: Walking and Moving Around Goal Status [phone]): At least 1 percent but less than 20 percent impaired, limited or restricted     Ivar Drape 07/11/2017, 3:05 PM   Samul Dada, PT MS Acute Rehab Dept. Number: [facility] [id number] and [facility] [phone]

## 2017-07-11 NOTE — Progress Notes (Signed)
PT Cancellation Note  Patient Details Name: Natalie Franklin MRN: [id number] DOB: [birth year]   Cancelled Treatment:    Reason Eval/Treat Not Completed: Other (comment).  Pt was too nauseated to get up this afternoon, did not work on her exercises with her even.  Expresses wish to get home today.   Ivar Drape 07/11/2017, 3:09 PM   Samul Dada, PT MS Acute Rehab Dept. Number: [facility] [id number] and [facility] [phone]

## 2017-07-11 NOTE — Evaluation (Signed)
Occupational Therapy Evaluation Patient Details Name: AVANNA SOWDER MRN: [id number] DOB: 07-20-1951 Today's Date: 07/11/2017    History of Present Illness Pt presents for L TKA with PMH of RA and hypothyroidism and R TKA in 8/18.   Clinical Impression   Pt with lethargy secondary to medication for nausea. Educated pt and husband in home safety and compensatory strategies for ADL. Pt plans to shower sitting on her shower seat and pivoting with husband's supervision. No further OT needs, given pt's recent R TKA, she is well aware of obstacles she may encounter and level of assist she will require. No further OT needs.    Follow Up Recommendations  No OT follow up    Equipment Recommendations  None recommended by OT    Recommendations for Other Services       Precautions / Restrictions Precautions Precautions: Knee Restrictions Weight Bearing Restrictions: Yes LLE Weight Bearing: Weight bearing as tolerated      Mobility Bed Mobility Overal bed mobility: Needs Assistance Bed Mobility: Supine to Sit;Sit to Supine     Supine to sit: Supervision Sit to supine: Supervision   General bed mobility comments: pt able to get to EOB using UE's and could manage LLE independently  Transfers   Equipment used: Rolling walker (2 wheeled) Transfers: Sit to/from [employer] Sit to Stand: Min assist              Balance     Sitting balance-Leahy Scale: Fair       Standing balance-Leahy Scale: Poor Standing balance comment: needed B UE support to stand                           ADL either performed or assessed with clinical judgement   ADL Overall ADL's : Needs assistance/impaired Eating/Feeding: Independent;Bed level   Grooming: Wash/dry hands;Sitting;Set up   Upper Body Bathing: Set up;Sitting   Lower Body Bathing: Minimal assistance;Sit to/from stand   Upper Body Dressing : Set up;Sitting   Lower Body Dressing: Minimal  assistance;Sit to/from stand   Toilet Transfer: Min guard;Ambulation;RW   Toileting- Architect and Hygiene: Min guard;Sit to/from stand       Functional mobility during ADLs: Min guard;Rolling walker General ADL Comments: Educated in home safety,      Vision         Perception     Praxis      Pertinent Vitals/Pain Pain Assessment: Faces Faces Pain Scale: Hurts little more Pain Location: L knee Pain Descriptors / Indicators: Sore Pain Intervention(s): Monitored during session;Repositioned;Premedicated before session     Hand Dominance Right   Extremity/Trunk Assessment Upper Extremity Assessment Upper Extremity Assessment: Overall WFL for tasks assessed   Lower Extremity Assessment Lower Extremity Assessment: Defer to PT evaluation   Cervical / Trunk Assessment Cervical / Trunk Assessment: Normal   Communication Communication Communication: No difficulties   Cognition Arousal/Alertness: Lethargic;Suspect due to medications Behavior During Therapy: [facility] for tasks assessed/performed Overall Cognitive Status: Within Functional Limits for tasks assessed                                     General Comments       Exercises     Shoulder Instructions      Home Living Family/patient expects to be discharged to:: Private residence Living Arrangements: Spouse/significant other Available Help at Discharge: Family;Available 24 hours/day  Type of Home: House Home Access: Stairs to enter [employer] of Steps: 5 Entrance Stairs-Rails: Right Home Layout: One level     Bathroom Shower/Tub: Chief Strategy Officer: Standard     Home Equipment: Environmental consultant - 2 wheels;Bedside commode;Shower seat;Hand held shower head          Prior Functioning/Environment Level of Independence: Independent                 OT Problem List: Impaired balance (sitting and/or standing);Decreased activity tolerance;Decreased knowledge  of use of DME or AE;Pain      OT Treatment/Interventions:      OT Goals(Current goals can be found in the care plan section) Acute Rehab OT Goals Patient Stated Goal: return home  OT Frequency:     Barriers to D/C:            Co-evaluation              AM-PAC PT "6 Clicks" Daily Activity     Outcome Measure Help from another person eating meals?: None Help from another person taking care of personal grooming?: A Little Help from another person toileting, which includes using toliet, bedpan, or urinal?: A Little Help from another person bathing (including washing, rinsing, drying)?: A Little Help from another person to put on and taking off regular upper body clothing?: None Help from another person to put on and taking off regular lower body clothing?: A Little 6 Click Score: 20   End of Session Equipment Utilized During Treatment: Rolling walker;Gait belt  Activity Tolerance: Patient limited by lethargy;Patient limited by fatigue Patient left: in bed;with call bell/phone within reach;with family/visitor present  OT Visit Diagnosis: Other abnormalities of gait and mobility (R26.89);Pain                Time: 9833-8250 OT Time Calculation (min): 14 min Charges:    G-Codes: OT G-codes **NOT FOR INPATIENT CLASS** Functional Assessment Tool Used: Clinical judgement Functional Limitation: Self care Self Care Current Status (N3976): At least 20 percent but less than 40 percent impaired, limited or restricted Self Care Discharge Status [phone]): At least 20 percent but less than 40 percent impaired, limited or restricted   07/11/2017 Martie Round, OTR/L Pager: [phone] [place] B[employer] 07/11/2017, 2:11 PM

## 2017-07-11 NOTE — Progress Notes (Signed)
Removed IV, provided discharge education/instructions, all questions and concerns addressed, discharged home accompanied by husband with all Pt belongings.

## 2017-07-11 NOTE — Progress Notes (Signed)
Anesthesiology Follow-up:  Perineal and buttock numbness resolved. Complaining of soreness in sacral area otherwise doing well. For discharge today.  Natalie Franklin

## 2017-07-11 NOTE — Progress Notes (Signed)
   Assessment / Plan: 1 Day Post-Op  S/P Procedure(s) (LRB): TOTAL KNEE ARTHROPLASTY (Left) by Dr. Jewel Baize. Murphy on 07/10/17  Active Problems:   Primary osteoarthritis of left knee   Primary osteoarthritis left knee-status post left total knee arthroplasty Pain controled. Residual perineal/buttock and lower extremity numbness from spinal anesthesia has resolved.   She is tolerating diet and urinating. Mobilizing in room.  Advance diet Up with therapy D/C IV fluids Incentive Spirometry Elevate and apply ice  Weight Bearing: Weight Bearing as Tolerated (WBAT) LLE Dressings: Maintain Mepilex.  Apply compression stocking LLE prior to discharge.  VTE prophylaxis: Aspirin, SCDs, ambulation Dispo: Home today after therapy evaluation  Subjective: Patient reports pain as moderate.  Tolerating diet.  Urinating.  No CP, SOB.   OOB mobilizing in the room.  Objective:   VITALS:   Vitals:   07/10/17 1621 07/10/17 2005 07/11/17 0020 07/11/17 0608  BP: (!) 144/69 125/66 114/69 (!) 129/59  Pulse: 64 80 64 60  Resp: 16 15 13 16   Temp: 97.8 F (36.6 C) 97.9 F (36.6 C) 98 F (36.7 C) 98.1 F (36.7 C)  TempSrc: Oral Oral Oral Oral  SpO2: 98% 100% 99% 98%  Weight:      Height:       CBC Latest Ref Rng & Units 06/29/2017 05/04/2017 05/03/2017  WBC 4.0 - 10.5 K/uL 14.6(H) 13.5(H) 16.2(H)  Hemoglobin 12.0 - 15.0 g/dL 05/05/2017 10.2(L) 9.7(L)  Hematocrit 36.0 - 46.0 % 42.2 33.1(L) 30.1(L)  Platelets 150 - 400 K/uL 435(H) 362 339   BMP Latest Ref Rng & Units 06/29/2017 05/04/2017 05/03/2017  Glucose 65 - 99 mg/dL 86 99 05/05/2017)  BUN 6 - 20 mg/dL 6 5(L) 5(L)  Creatinine 0.44 - 1.00 mg/dL 275(T 7.00 1.74  Sodium 135 - 145 mmol/L 136 137 135  Potassium 3.5 - 5.1 mmol/L 3.8 3.8 4.1  Chloride 101 - 111 mmol/L 105 105 107  CO2 22 - 32 mmol/L 23 25 23   Calcium 8.9 - 10.3 mg/dL 9.2 9.44) )   Intake/Output      10/30 0701 - 10/31 0700 10/31 0701 - 11/01 0700   P.O. 480    I.V. (mL/kg)  1583.3 (34.8)    IV Piggyback 0    Total Intake(mL/kg) 2063.3 (45.3)    Urine (mL/kg/hr) 200    Blood 25    Total Output 225     Net +1838.3          Urine Occurrence 3 x       Physical Exam: General: NAD.,  Conversant sitting upright in bed.  Husband at bedside. Resp: No increased wob Cardio: regular rate and rhythm ABD soft Neurologically intact MSK Neurovascularly intact Sensation intact distally Intact pulses distally Dorsiflexion/Plantar flexion intact Incision: dressing C/D/I   11/31 III, PA-C 07/11/2017, 7:39 AM

## 2017-07-11 NOTE — Care Management Note (Signed)
Case Management Note Donn Pierini RN, BSN Unit 4E-Case Manager-- 5N coverage [phone]  Patient Details  Name: Natalie Franklin MRN: [id number] Date of Birth: 1951/05/18  Subjective/Objective:  Pt admitted s/p total knee                Action/Plan: PTA pt lived at home with spouse- referral for DME and HH needs- per conversation with pt she has declined CPM machine this time- and states she has all needed DME that includes RW and 3n1- pt had [facility] for HHPT after last total knee- plans to use them again and has already spoken with therapist Vincenza Hews- call made to [facility] with [facility] to check on [facility] referral with [facility].   Expected Discharge Date:  07/11/17               Expected Discharge Plan:  Home w Home Health Services  In-House Referral:     Discharge planning Services  CM Consult  Post Acute Care Choice:  Home Health Choice offered to:  Patient  DME Arranged:  N/A DME Agency:  NA  HH Arranged:  PT HH Agency:  [facility] (formerly [facility]  Status of Service:  Completed, signed off  If discussed at [employer], dates discussed:    Additional Comments:  Natalie Span, RN 07/11/2017, 11:09 AM

## 2018-08-29 IMAGING — DX DG KNEE 1-2V PORT*R*
2 series · 2 of 2 positions shown · non-contrast
Comparison: None.

CLINICAL DATA: Postop for right knee arthroplasty.

EXAM:
PORTABLE RIGHT KNEE - 1-2 VIEW

[knee ap]
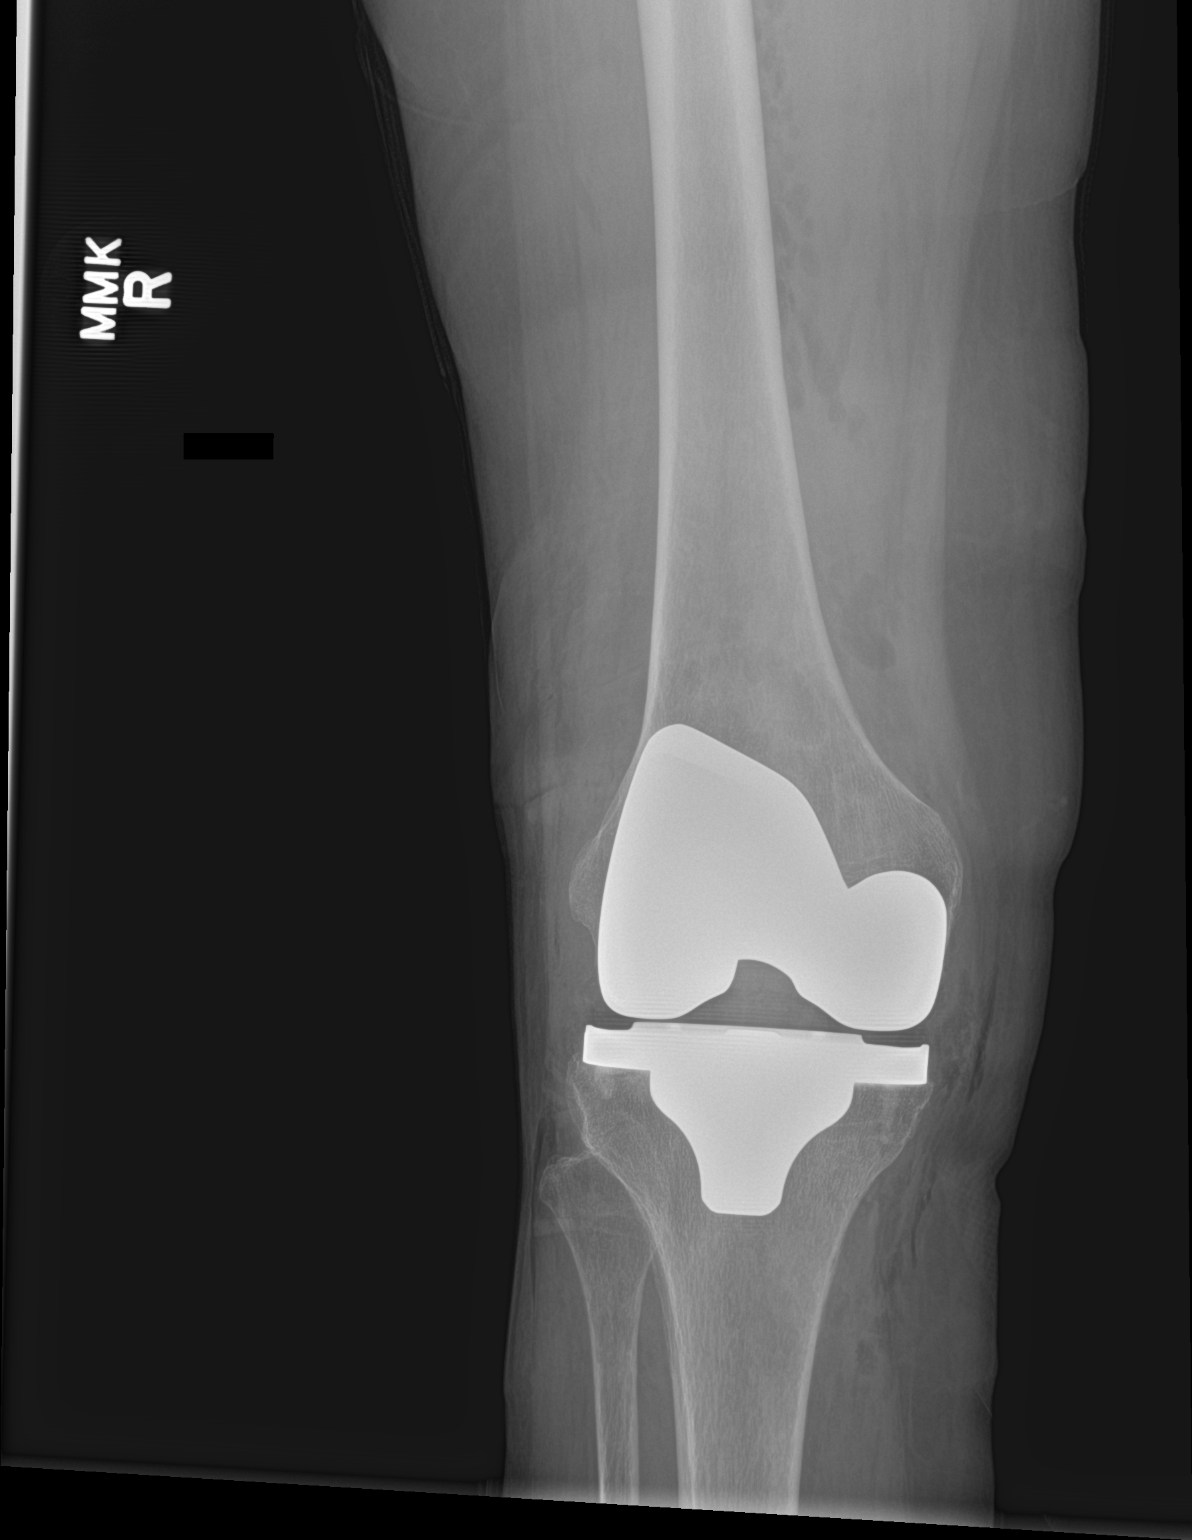

[knee lat]
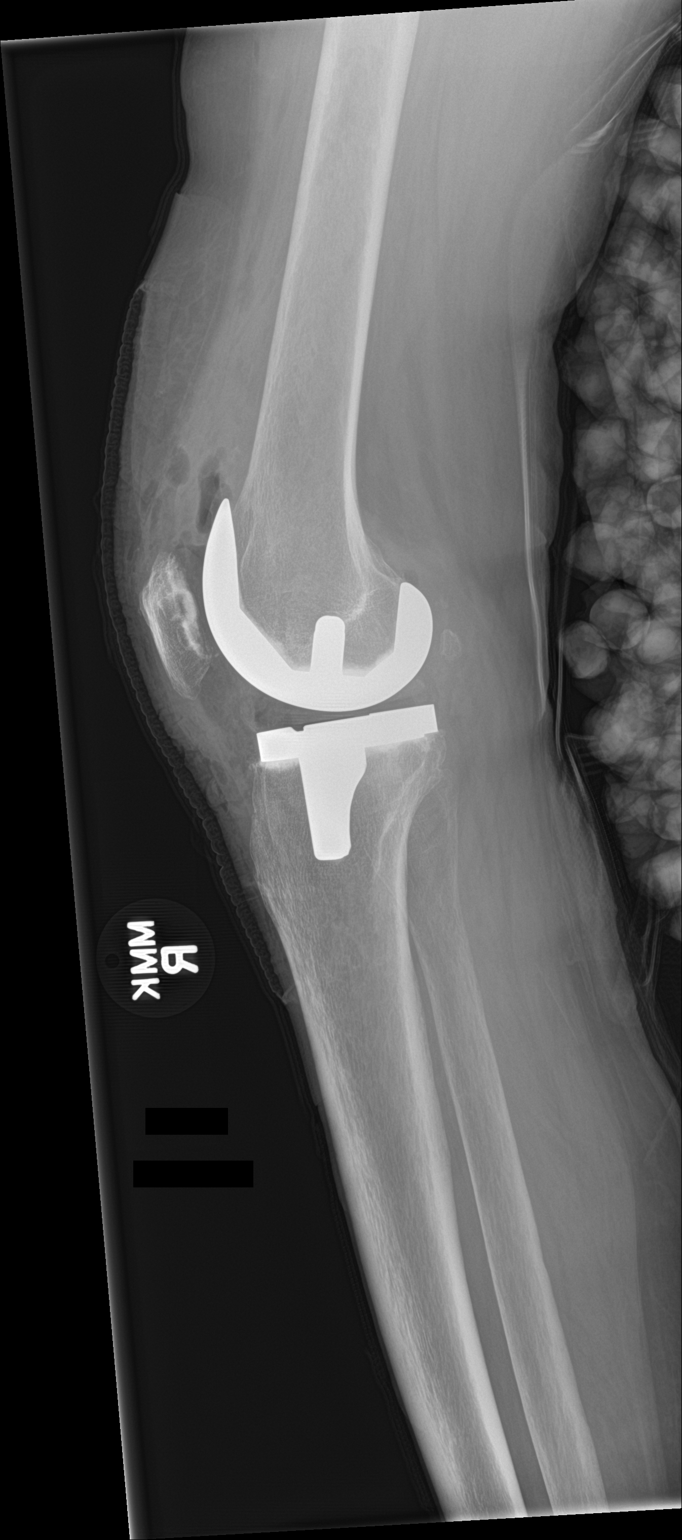

[2 of 2 positions shown; findings below may reference images not displayed]

FINDINGS: Total knee arthroplasty. No periprosthetic fracture or acute
hardware complication. Expected soft tissue swelling and gas about
the knee and within the suprapatellar bursa.
IMPRESSION: Expected appearance after right knee arthroplasty.

## 2018-11-06 IMAGING — DX DG KNEE 1-2V PORT*L*
2 series · 2 of 2 positions shown · non-contrast
Comparison: None.

CLINICAL DATA: Status post total knee replacement

EXAM:
PORTABLE LEFT KNEE - 1-2 VIEW

[knee ap]
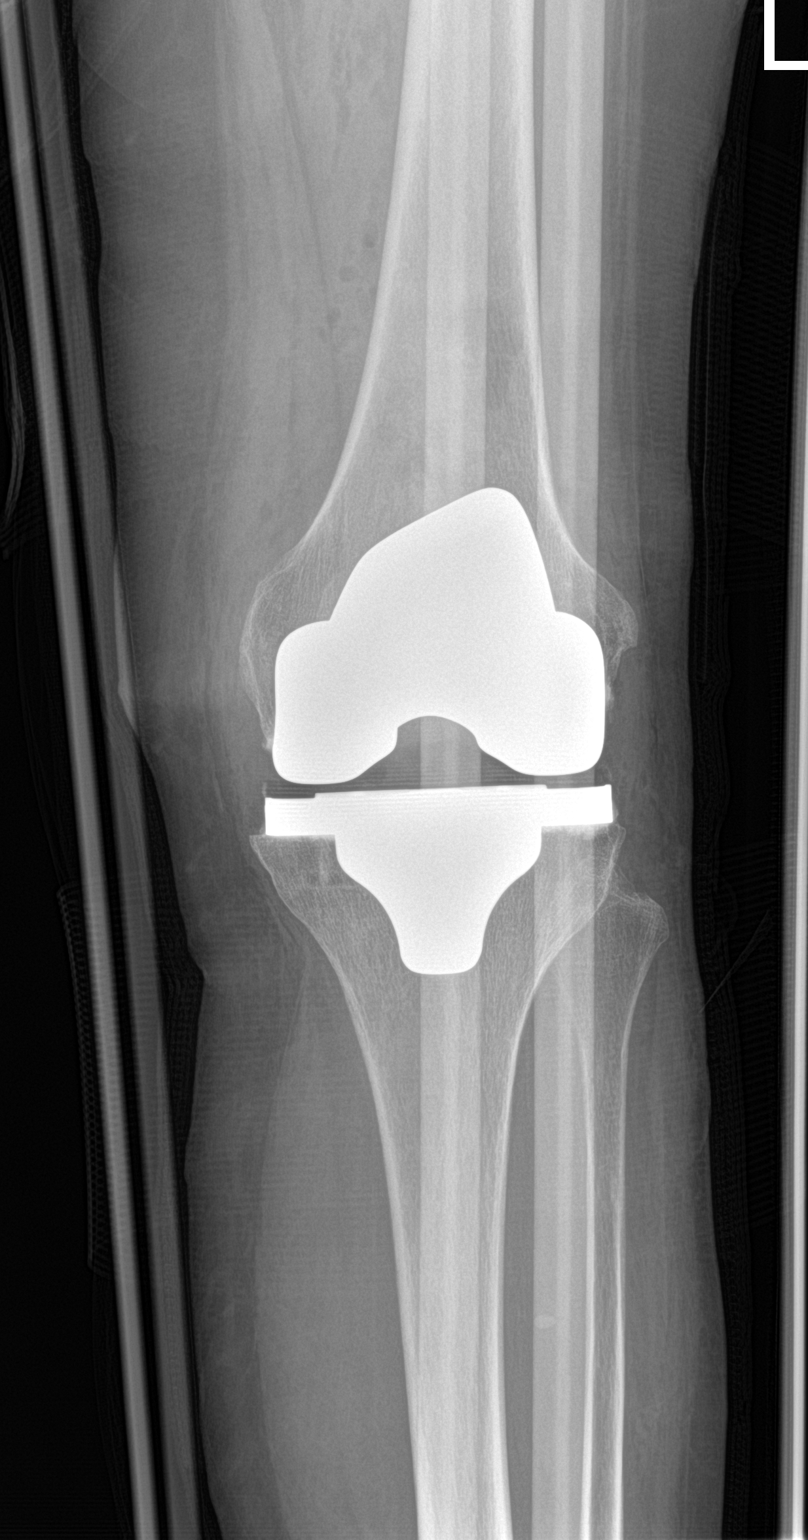

[knee lat]
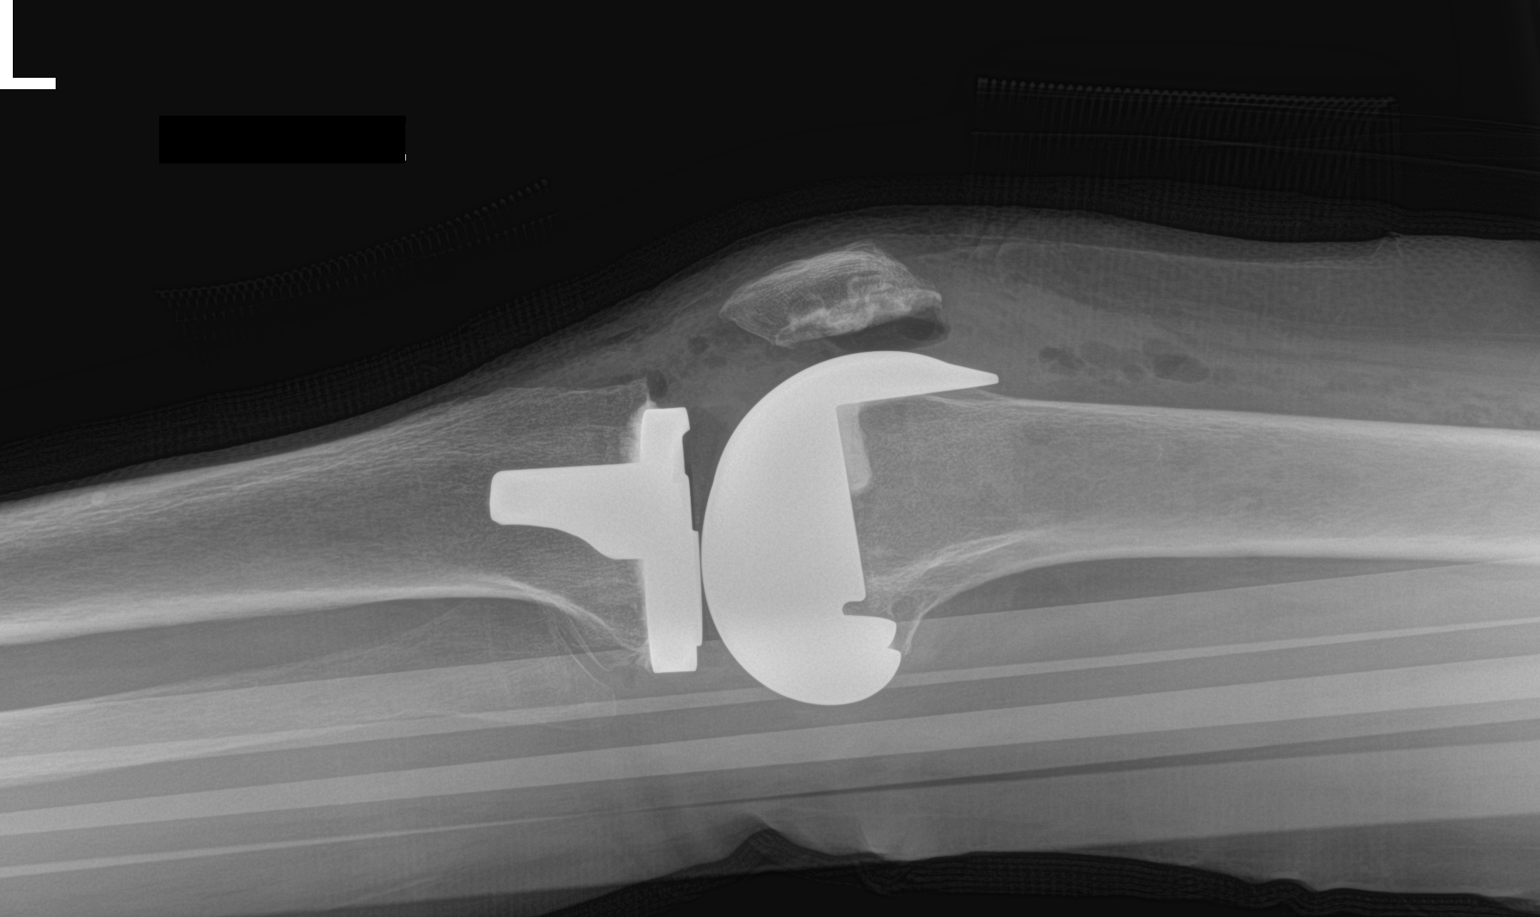

[2 of 2 positions shown; findings below may reference images not displayed]

FINDINGS: Frontal and lateral views were obtained. There is a total knee
replacement with femoral and prosthetic components well-seated. No
acute fracture dislocation. Soft tissue air is an expected
postoperative finding.
IMPRESSION: Total knee replacement with prosthetic components well-seated. No
acute fracture or dislocation.

## 2019-11-07 ENCOUNTER — Ambulatory Visit: Payer: Medicare Other | Attending: Internal Medicine
